# Patient Record
Sex: Female | Born: 1950
Health system: Southern US, Community
[De-identification: ages and names within clinical notes are randomized; demographics above are authoritative.]

## PROBLEM LIST (undated history)

## (undated) DIAGNOSIS — I10 Essential (primary) hypertension: Secondary | ICD-10-CM

## (undated) DIAGNOSIS — N390 Urinary tract infection, site not specified: Secondary | ICD-10-CM

## (undated) DIAGNOSIS — K589 Irritable bowel syndrome without diarrhea: Secondary | ICD-10-CM

## (undated) DIAGNOSIS — R5383 Other fatigue: Secondary | ICD-10-CM

## (undated) DIAGNOSIS — G245 Blepharospasm: Secondary | ICD-10-CM

## (undated) DIAGNOSIS — R0602 Shortness of breath: Secondary | ICD-10-CM

## (undated) DIAGNOSIS — G43909 Migraine, unspecified, not intractable, without status migrainosus: Secondary | ICD-10-CM

## (undated) HISTORY — DX: Shortness of breath: R06.02

## (undated) HISTORY — DX: Irritable bowel syndrome, unspecified: K58.9

## (undated) HISTORY — PX: OTHER SURGICAL HISTORY: SHX169

## (undated) HISTORY — DX: Blepharospasm: G24.5

## (undated) HISTORY — DX: Migraine, unspecified, not intractable, without status migrainosus: G43.909

## (undated) HISTORY — DX: Other fatigue: R53.83

## (undated) HISTORY — PX: BLEPHAROPLASTY: SUR158

## (undated) HISTORY — DX: Essential (primary) hypertension: I10

## (undated) HISTORY — DX: Urinary tract infection, site not specified: N39.0

---

## 2004-04-21 ENCOUNTER — Encounter: Admission: RE | Admit: 2004-04-21 | Discharge: 2004-05-28 | Payer: Self-pay | Admitting: Orthopedic Surgery

## 2004-08-11 ENCOUNTER — Encounter: Admission: RE | Admit: 2004-08-11 | Discharge: 2004-09-16 | Payer: Self-pay | Admitting: Orthopedic Surgery

## 2005-05-10 ENCOUNTER — Other Ambulatory Visit: Admission: RE | Admit: 2005-05-10 | Discharge: 2005-05-10 | Payer: Self-pay | Admitting: Internal Medicine

## 2006-01-25 ENCOUNTER — Encounter: Admission: RE | Admit: 2006-01-25 | Discharge: 2006-01-25 | Payer: Self-pay | Admitting: Orthopedic Surgery

## 2007-08-03 ENCOUNTER — Encounter: Admission: RE | Admit: 2007-08-03 | Discharge: 2007-08-03 | Payer: Self-pay | Admitting: Orthopedic Surgery

## 2011-09-21 ENCOUNTER — Other Ambulatory Visit: Payer: Self-pay | Admitting: Obstetrics and Gynecology

## 2011-09-21 ENCOUNTER — Other Ambulatory Visit (HOSPITAL_COMMUNITY)
Admission: RE | Admit: 2011-09-21 | Discharge: 2011-09-21 | Disposition: A | Discharge status: Home / Self Care | Payer: BC Managed Care – PPO | Source: Ambulatory Visit | Attending: Obstetrics and Gynecology | Admitting: Obstetrics and Gynecology

## 2011-09-21 DIAGNOSIS — Z124 Encounter for screening for malignant neoplasm of cervix: Secondary | ICD-10-CM | POA: Insufficient documentation

## 2012-12-20 HISTORY — PX: EYE SURGERY: SHX253

## 2014-05-27 ENCOUNTER — Ambulatory Visit: Payer: BC Managed Care – PPO | Attending: Orthopedic Surgery | Admitting: Physical Therapy

## 2014-05-27 DIAGNOSIS — M25559 Pain in unspecified hip: Secondary | ICD-10-CM | POA: Insufficient documentation

## 2014-05-27 DIAGNOSIS — IMO0001 Reserved for inherently not codable concepts without codable children: Secondary | ICD-10-CM | POA: Insufficient documentation

## 2014-05-27 DIAGNOSIS — M545 Low back pain, unspecified: Secondary | ICD-10-CM | POA: Insufficient documentation

## 2014-06-05 ENCOUNTER — Ambulatory Visit: Payer: BC Managed Care – PPO | Admitting: Physical Therapy

## 2014-10-23 ENCOUNTER — Ambulatory Visit (INDEPENDENT_AMBULATORY_CARE_PROVIDER_SITE_OTHER): Payer: BC Managed Care – PPO | Admitting: Neurology

## 2014-10-23 ENCOUNTER — Encounter: Payer: Self-pay | Admitting: Neurology

## 2014-10-23 VITALS — BP 117/65 | HR 62 | Temp 97.6°F | Ht 67.0 in | Wt 143.0 lb

## 2014-10-23 DIAGNOSIS — R29898 Other symptoms and signs involving the musculoskeletal system: Secondary | ICD-10-CM

## 2014-10-23 DIAGNOSIS — R202 Paresthesia of skin: Secondary | ICD-10-CM

## 2014-10-23 NOTE — Progress Notes (Signed)
Subjective:    Patient ID: Patricia Henry is a 63 y.o. female.  HPI     Star Age, MD, PhD [facility] [address], [address] [place], [place] [address]  Dear Dr. Lynann Bologna,   I saw your patient, Patricia Henry, upon your kind request in my neurologic clinic today for initial consultation of her leg weakness. The patient is accompanied by her husband today. As you know, Ms. An is a 31 year old right-handed woman with an underlying medical history of scoliosis, spinal stenosis, hip bursitis (s/p injections in August, helped for 1 month), who has had progressive lower extremity weakness. She had an MRI lumbar spine on 05/10/2014 which showed mild bulging annulus and mild bilateral lateral recess encroachment at L2 2-3, unchanged, no spinal or foraminal stenosis. Mild annular bulge and slight flattening of the ventral thecal sac at L4-5 with mild bilateral lateral recess encroachment. This is a slightly progressive finding. Generous spinal canal without significant spinal or foraminal stenosis.  She reports a 6-12 month history of leg and hip weakness. She feels overall weak. She has difficulty with balance and reports difficulty with walking and climbing stairs in particular. She has never had an EMG or nerve conduction study done. She was in PT for about 6 weeks, which she feels did not help. She goes to the gym and does Yoga.  She denies twitching in her muscles, wasting, or permanent numbness, some intermittent tingling in the feet and her fingertips. No FHx of nerve or muscle disease, such at CMT or ALS. She has occasional leg cramping. She has not fallen. She feels her balance is impaired. She has not used a cane or a walker. She was recently started on a blood pressure medication namely lisinopril and hydrochlorothiazide combination by her primary care physician and takes Ambien 10 mg strength quarter to half a pill as needed. Otherwise, she takes a number of  over the counter supplements including turmeric, multivitamin, calcium and vitamin D, probiotic, cranberry and for dry eyes she takes eyedrops.  Her Past Medical History Is Significant For: Past Medical History  Diagnosis Date  . Meibomitis   . Hypertension   . IBS (irritable bowel syndrome)     Her Past Surgical History Is Significant For: History reviewed. No pertinent past surgical history.  Her Family History Is Significant For: Family History  Problem Relation Age of Onset  . Alzheimer's disease Mother   . Heart failure Mother     Her Social History Is Significant For: History   Social History  . Marital Status: Married    Spouse Name: Patricia Henry    Number of Children: 0  . Years of Education: 15   Occupational History  .      [employer]   Social History Main Topics  . Smoking status: Never Smoker   . Smokeless tobacco: Never Used  . Alcohol Use: 0.0 oz/week    0 Not specified per week     Comment: occas. wine  . Drug Use: No  . Sexual Activity: None   Other Topics Concern  . None   Social History Narrative   Patient consumes 2 cups of caffiene ,right handed    Her Allergies Are:  No Known Allergies:   Her Current Medications Are:  Outpatient Encounter Prescriptions as of 10/23/2014  Medication Sig  . ANA-LEX 2-2 % KIT   . Cholecalciferol (VITAMIN D-3 PO) Take 600 mg by mouth daily. Plus calcium, take daily am  .  CRANBERRY PO Take by mouth.  . Flaxseed, Linseed, (FLAX SEEDS PO) Take by mouth daily.  Marland Kitchen lisinopril-hydrochlorothiazide (PRINZIDE,ZESTORETIC) 10-12.5 MG per tablet daily.   . Multiple Vitamin (MULTIVITAMIN) capsule Take 1 capsule by mouth daily.  Marland Kitchen Phenylephrine HCl (AFRIN ALLERGY NA) Place into the nose. Diluted with ocean nasal spray  . Probiotic Product (PROBIOTIC DAILY PO) Take by mouth. Flora IB,digestion  . Tetrahydrozoline HCl (EYE DROPS OP) Apply to eye 4 (four) times daily.  . TURMERIC PO Take 1,000 mg by mouth daily.  Marland Kitchen XIFAXAN 550  MG TABS tablet   . zolpidem (AMBIEN) 10 MG tablet Take 10 mg by mouth at bedtime as needed for sleep. 1/4 -1/2 tablet as needed  :   Review of Systems:  Out of a complete 14 point review of systems, all are reviewed and negative with the exception of these symptoms as listed below:   Review of Systems  Constitutional: Positive for fatigue.  Eyes:       Blurred vision  Gastrointestinal: Positive for diarrhea.  Endocrine: Positive for cold intolerance.  Genitourinary:       Incontinence  Musculoskeletal:       Joint pain , joint swelling  Hematological: Bruises/bleeds easily.    Objective:  Neurologic Exam  Physical Exam Physical Examination:   Filed Vitals:   10/23/14 0935  BP: 117/65  Pulse: 62  Temp: 97.6 F (36.4 C)    General Examination: The patient is a very pleasant 63 y.o. female in no acute distress. She appears well-developed and well-nourished and very well groomed. She appears nervous.  HEENT: Normocephalic, atraumatic, pupils are equal, round and reactive to light and accommodation. Funduscopic exam is normal with sharp disc margins noted. Conjunctiva are mildly irritated appearing. She has dry appearing eyes. She blinks multiple times before I can check her eyes with the light. Extraocular tracking is good without limitation to gaze excursion or nystagmus noted. Normal smooth pursuit is noted. Hearing is grossly intact. Tympanic membranes are clear bilaterally. Face is symmetric with normal facial animation and normal facial sensation. Speech is clear with no dysarthria noted. There is no hypophonia. There is no lip, neck/head, jaw or voice tremor. Neck is supple with full range of passive and active motion. There are no carotid bruits on auscultation. Oropharynx exam reveals: moderate mouth dryness, adequate dental hygiene and no significant airway crowding. Mallampati is class II. Tongue protrudes centrally and palate elevates symmetrically.    Chest: Clear to  auscultation without wheezing, rhonchi or crackles noted.  Heart: S1+S2+0, regular and normal without murmurs, rubs or gallops noted.   Abdomen: Soft, non-tender and non-distended with normal bowel sounds appreciated on auscultation.  Extremities: There is no pitting edema in the distal lower extremities bilaterally. Pedal pulses are intact.  Skin: Warm and dry without trophic changes noted. There are no varicose veins.  Musculoskeletal: exam reveals no obvious joint deformities, tenderness or joint swelling or erythema.   Neurologically:  Mental status: The patient is awake, alert and oriented in all 4 spheres. Her immediate and remote memory, attention, language skills and fund of knowledge are appropriate. There is no evidence of aphasia, agnosia, apraxia or anomia. Speech is clear with normal prosody and enunciation. Thought process is linear. Mood is Fairly normal appearing, but she appears anxious.  Cranial nerves II - XII are as described above under HEENT exam. In addition: shoulder shrug is normal with equal shoulder height noted. Motor exam: Normal bulk, strength and tone is noted. There is  no fasciculation especially not in the distal lower extremities and I do not see any atrophy. She has no myoclonus.There is no drift, tremor or rebound. Romberg is negative, but it does take her a while to get settled for Romberg testing. She has in the beginning a lot of sway and finger movements of her upper body trying to reach out with her eyes closed to her husband and myself as I was standing by. However, after a little while Romberg was completely negative. Reflexes are 2+ in the upper extremities and 1+ in the lower extremities. Babinski: Toes are flexor bilaterally. Fine motor skills and coordination: intact with normal finger taps, normal hand movements, normal rapid alternating patting, normal foot taps and normal foot agility.  Cerebellar testing: No dysmetria or intention tremor on finger to  nose testing. Heel to shin is unremarkable bilaterally. There is no truncal or gait ataxia.  Sensory exam: intact to light touch, pinprick, vibration, temperature sense and proprioception in the upper and lower extremities.  Gait, station and balance: She stands easily. No veering to one side is noted. No leaning to one side is noted. Posture is age-appropriate and stance is narrow based. Gait shows normal stride length and normal pace. No problems turning are noted. She turns en bloc. Tandem walk is slightly difficult for her, but she can do it. Intact toe and heel stance is noted.               Assessment and Plan:   In summary, OVIYA AMMAR is a very pleasant 63 y.o.-year old female with an underlying medical history of scoliosis, spinal stenosis, hip bursitis (s/p injections in August, helped for 1 month), who has had progressive lower extremity weakness for the past 6-12 months. Upon examination she has a normal neurological in general exam and I reassured her and her husband in that regard. I'm not sure how to explain her symptoms. I would like to pursue further testing in the form of blood work and EMG nerve conduction studies to her lower extremities. We will keep her posted over the phone with her test results. I will see her back after these are done. She has already pursued physical therapy and did not find it useful. I suggested she start using a cane for safety. I answered all their questions today and the patient and her husband were in agreement.   Thank you very much for allowing me to participate in the care of this nice patient. If I can be of any further assistance to you please do not hesitate to call me at [phone].  Sincerely,   Star Age, MD, PhD

## 2014-10-23 NOTE — Patient Instructions (Signed)
We will do more testing with blood work and electrical nerve and muscle testing. We will call you with the results.  Try using a cane for balance.

## 2014-10-24 NOTE — Progress Notes (Signed)
Quick Note:  Please call and advise the patient that the recent labs we checked were within normal limits. Please remind patient to keep any upcoming appointments or tests and to call us with any interim questions, concerns, problems or updates. Thanks,  Star Age, MD, PhD    ______

## 2014-10-25 ENCOUNTER — Encounter (INDEPENDENT_AMBULATORY_CARE_PROVIDER_SITE_OTHER): Payer: Self-pay

## 2014-10-25 ENCOUNTER — Ambulatory Visit (INDEPENDENT_AMBULATORY_CARE_PROVIDER_SITE_OTHER): Payer: BC Managed Care – PPO | Admitting: Neurology

## 2014-10-25 DIAGNOSIS — R202 Paresthesia of skin: Secondary | ICD-10-CM

## 2014-10-25 DIAGNOSIS — R29898 Other symptoms and signs involving the musculoskeletal system: Secondary | ICD-10-CM

## 2014-10-25 DIAGNOSIS — Z0289 Encounter for other administrative examinations: Secondary | ICD-10-CM

## 2014-10-25 LAB — IFE AND PE, SERUM
ALPHA2 GLOB SERPL ELPH-MCNC: 0.7 g/dL (ref 0.4–1.2)
Albumin SerPl Elph-Mcnc: 4 g/dL (ref 3.2–5.6)
Albumin/Glob SerPl: 1.5 (ref 0.7–2.0)
Alpha 1: 0.2 g/dL (ref 0.1–0.4)
B-GLOBULIN SERPL ELPH-MCNC: 0.9 g/dL (ref 0.6–1.3)
GAMMA GLOB SERPL ELPH-MCNC: 0.8 g/dL (ref 0.5–1.6)
GLOBULIN, TOTAL: 2.7 g/dL (ref 2.0–4.5)
IGA/IMMUNOGLOBULIN A, SERUM: 192 mg/dL (ref 91–414)
IGG (IMMUNOGLOBIN G), SERUM: 750 mg/dL (ref 700–1600)
IGM (IMMUNOGLOBULIN M), SRM: 99 mg/dL (ref 40–230)
Total Protein: 6.7 g/dL (ref 6.0–8.5)

## 2014-10-25 LAB — HGB A1C W/O EAG: HEMOGLOBIN A1C: 5.3 % (ref 4.8–5.6)

## 2014-10-25 LAB — TSH: TSH: 2.74 u[IU]/mL (ref 0.450–4.500)

## 2014-10-25 LAB — RHEUMATOID FACTOR: Rhuematoid fact SerPl-aCnc: 9.9 IU/mL (ref 0.0–13.9)

## 2014-10-25 LAB — VITAMIN E: VITAMIN E (ALPHA TOCOPHEROL): 18.5 mg/L — AB (ref 4.6–17.8)

## 2014-10-25 LAB — VITAMIN D 25 HYDROXY (VIT D DEFICIENCY, FRACTURES): Vit D, 25-Hydroxy: 39.3 ng/mL (ref 30.0–100.0)

## 2014-10-25 LAB — ANA W/REFLEX: Anti Nuclear Antibody(ANA): NEGATIVE

## 2014-10-25 LAB — SEDIMENTATION RATE: SED RATE: 2 mm/h (ref 0–40)

## 2014-10-25 LAB — RPR: RPR: NONREACTIVE

## 2014-10-25 LAB — B12 AND FOLATE PANEL: Vitamin B-12: 674 pg/mL (ref 211–946)

## 2014-10-25 LAB — CK: CK TOTAL: 107 U/L (ref 24–173)

## 2014-10-25 NOTE — Progress Notes (Signed)
    [facility]    Provider:  Dr Jaynee Eagles Referring Provider: Stephens Shire, MD Primary Care Physician:  Stephens Shire, MD  History:  [name] is a 63 y.o. female here as a referral from Dr. Tollie Pizza for weakness of the bilateral legs. She still goes to the gym but has weakness especially after exertion. She did the recumbent bike for 30 minutes without a problem but then had a difficult time walking. Stairs are becoming difficult. If she puts weight on her legs, she feels weak. She has pain in the hips. She can get out of low seats without problems. She has chronic low back pain. No radicular symptoms but when gets out of bed has numbness and tingling in the legs that resolve with movement. Neither leg is worse but they seem to alternate in severity - currently the right leg is worse. She can't walk more than 1/2 mile without fatigue and then can hardly pick up the legs like she has 10 pounds of sugar strapped on her legs. Symptoms consistently start after a 1/2 mile and resolve with a few minutes of sitting; Sitting helps and then she can get up and walk again.   Summary  Nerve conduction studies were performed on the bilateral lower extremities:  Bilateral Tibial and Peroneal motor nerves showed normal conductions with normal F Wave latencies Bilateral Peroneal sensory nerve conductions were within normal limits Bilateral H Reflexes showed normal latencies  EMG Needle study was performed on selected right lower extremity and right paraspinal muscles:   The Iliopsoas, Adductor Magnus, Vastus Medialis, Anterior Tibialis, Medial Gastrocnemius, Extensor Hallucis Longus, Biceps Femoris (long head), Gluteus Maximus, Gluteus Medius, L3/L4/L5/S1 paraspinal muscles were within normal limits.  Conclusion: This is a normal study. No electrophysiologic evidence for myopathy/myositis, peripheral polyneuropathy or lumbosacral radiculopathy. However patient's symptoms are  suggestive of neurogenic claudication and many patients with lumbar spine stenosis have a normal EMG/NCS evaluation.  Clinical correlation recommended.   Sarina Ill, MD  [facility] [address] [place], [place] [address]  Phone [phone] Fax [phone]

## 2014-10-28 ENCOUNTER — Telehealth: Payer: Self-pay | Admitting: Neurology

## 2014-10-28 NOTE — Telephone Encounter (Signed)
Please call and advise the patient that the recent EMG and nerve conduction velocity test, which is the electrical nerve and muscle test we we performed, was reported as within normal limits. We checked for abnormal electrical discharges in the muscles or nerves and the report suggested normal findings. No further action is required on this test at this time. Please remind patient to keep any upcoming appointments or tests and to call us with any interim questions, concerns, problems or updates. Thanks,  Bernhard Koskinen, MD, PhD 

## 2014-10-29 NOTE — Telephone Encounter (Signed)
Called and left VM message for return call to share Dr Guadelupe Sabin message with patient

## 2014-10-29 NOTE — Telephone Encounter (Signed)
Please advise patient that we can certainly play it by ear. The only lab test that was slightly abnormal was the vitamin E level which was borderline elevated. If she is taking vitamin E supplement she may not need to continue with it.if she feels stable by the time she her two-month follow-up appointment approaches she can decide if she wants to keep it or cancel it. It may be a good idea to try to keep the appointment just so she has it if she needs it. She can always cancel it a day or 2 ahead of time.

## 2014-10-29 NOTE — Telephone Encounter (Signed)
Shared results with patient and she verbalized understanding but wanted to know if she should keep her 2 month f/u appt.since all test are normal?

## 2014-10-30 NOTE — Telephone Encounter (Signed)
Patient returned Patricia Henry's call and I shared Dr. Guadelupe Sabin message with patient.

## 2014-12-23 ENCOUNTER — Ambulatory Visit: Payer: BC Managed Care – PPO | Admitting: Neurology

## 2014-12-26 ENCOUNTER — Ambulatory Visit: Payer: BC Managed Care – PPO | Admitting: Internal Medicine

## 2015-02-18 ENCOUNTER — Ambulatory Visit: Payer: BC Managed Care – PPO | Admitting: Internal Medicine

## 2016-11-03 ENCOUNTER — Encounter: Payer: Self-pay | Admitting: Internal Medicine

## 2016-11-30 ENCOUNTER — Encounter: Payer: Self-pay | Admitting: Physician Assistant

## 2016-11-30 ENCOUNTER — Encounter (INDEPENDENT_AMBULATORY_CARE_PROVIDER_SITE_OTHER): Payer: Self-pay

## 2016-11-30 ENCOUNTER — Ambulatory Visit (INDEPENDENT_AMBULATORY_CARE_PROVIDER_SITE_OTHER): Payer: Medicare Other | Admitting: Physician Assistant

## 2016-11-30 VITALS — BP 130/74 | HR 72 | Ht 67.0 in | Wt 154.6 lb

## 2016-11-30 DIAGNOSIS — K580 Irritable bowel syndrome with diarrhea: Secondary | ICD-10-CM

## 2016-11-30 DIAGNOSIS — K529 Noninfective gastroenteritis and colitis, unspecified: Secondary | ICD-10-CM

## 2016-11-30 MED ORDER — RIFAXIMIN 550 MG PO TABS: 550.0000 mg | ORAL_TABLET | Freq: Three times a day (TID) | ORAL | 0 refills | Status: DC

## 2016-11-30 NOTE — Patient Instructions (Addendum)
We sent a prescription to [facility].  1. Xifaxan 550 mg.  Continue a Probiotic daily.

## 2016-11-30 NOTE — Progress Notes (Addendum)
Subjective:    Patient ID: Patricia Henry, female    DOB: 06-25-1951, 65 y.o.   MRN: [id number]  HPI Patricia Henry is a pleasant 65 year old white female, new to GI today who wishes to establish with Dr. Carlean Purl. She states that she had prior workup with Dr.Mann with colonoscopy 5 or 6 years ago which she believes was negative. He was given a diagnosis of IBS. She states that she has been seeing a naturopath/Jill Meredith Pel and has been on a variety of supplements and probiotics. She did get a course of Xifaxan in July 2017, she does not remember how long she took the medication but states that it did seem to help her symptoms but shortly thereafter she was diagnosed with urinary tract infection and had take a course of Cipro. She says now over the past 6 weeks she has been having more ongoing diarrhea. She says generally she'll have one to 2 loose stools per day that over the past 6 weeks has been having 3-4 loose stools per day. She's not noted any melena or hematochezia. She's not having any significant bloating or distention no nausea or vomiting. She does get some intermittent cramping. She says she would like to be treated for small intestinal bacterial overgrowth with a course of Xifaxan. She is also had food allergy testing through her naturopath and has a variety of foods that she is sensitive to including cows milk, cheese cumin cayenne pepper eggplant. She has been avoiding these foods. She also says that she's been under a lot of stress recently with furniture market and home renovations. Family history is negative for colon cancer and IBD.  Review of Systems Pertinent positive and negative review of systems were noted in the above HPI section.  All other review of systems was otherwise negative.  Outpatient Encounter Prescriptions as of 11/30/2016  Medication Sig  . clonazePAM (KLONOPIN) 1 MG tablet Take 1 tablet by mouth at bedtime.  Marland Kitchen CRANBERRY PO Take by mouth.  . NON FORMULARY   .  Phenylephrine HCl (AFRIN ALLERGY NA) Place into the nose. Diluted with ocean nasal spray  . Probiotic Product (PROBIOTIC DAILY PO) Take by mouth. Flora IB,digestion  . rifaximin (XIFAXAN) 550 MG TABS tablet Take 1 tablet (550 mg total) by mouth 3 (three) times daily.  . [DISCONTINUED] ANA-LEX 2-2 % KIT   . [DISCONTINUED] Cholecalciferol (VITAMIN D-3 PO) Take 600 mg by mouth daily. Plus calcium, take daily am  . [DISCONTINUED] Flaxseed, Linseed, (FLAX SEEDS PO) Take by mouth daily.  . [DISCONTINUED] lisinopril-hydrochlorothiazide (PRINZIDE,ZESTORETIC) 10-12.5 MG per tablet daily.   . [DISCONTINUED] Multiple Vitamin (MULTIVITAMIN) capsule Take 1 capsule by mouth daily.  . [DISCONTINUED] Tetrahydrozoline HCl (EYE DROPS OP) Apply to eye 4 (four) times daily.  . [DISCONTINUED] TURMERIC PO Take 1,000 mg by mouth daily.  . [DISCONTINUED] XIFAXAN 550 MG TABS tablet   . [DISCONTINUED] zolpidem (AMBIEN) 10 MG tablet Take 10 mg by mouth at bedtime as needed for sleep. 1/4 -1/2 tablet as needed   No facility-administered encounter medications on file as of 11/30/2016.    No Known Allergies There are no active problems to display for this patient.  Social History   Social History  . Marital status: Married    Spouse name: Jenny Reichmann  . Number of children: 0  . Years of education: 15   Occupational History  .      [employer]   Social History Main Topics  . Smoking status: Never Smoker  .  Smokeless tobacco: Never Used  . Alcohol use 0.0 oz/week     Comment: occas. wine  . Drug use: No  . Sexual activity: Not on file   Other Topics Concern  . Not on file   Social History Narrative   Patient consumes 2 cups of caffiene ,right handed    Ms. Harrington's family history includes Alzheimer's disease in her mother; Heart failure in her mother.      Objective:    Vitals:   11/30/16 0934  BP: 130/74  Pulse: 72    Physical Exam Well-developed older white female in no acute distress, blood  pressure 130/74 pulse 72 height 5 foot 7 weight 154, BMI 24.2. HEENT; nontraumatic, cephalic EOMI PERRLA sclera anicteric, Cardiovascular; regular rate and rhythm with S1-S2 no murmur or gallop, Pulmonary ;clear bilaterally, Abdomen; soft, nontender nondistended bowel sounds are active no palpable mass or hepatosplenomegaly, Rectal; exam and, Ext;no clubbing cyanosis or edema skin warm and dry, Neuropsych ;mood and affect appropriate       Assessment & Plan:   #28 65 year old white female with probable IBS D, and possible small bowel bacterial overgrowth  Plan; Patient will continue daily probiotic, and avoid trigger foods Will obtain her previous records from Dr. Juanita Craver Start Xifaxan 550 mg by mouth 3 times a day 14 days Plan follow-up office visit with Dr. Carlean Purl in 6 weeks. She is advised to call in the interim for problems.   Addendum; colonoscopy report obtained from Dr. Juanita Craver - Colonoscopy 10/13/2011 with finding of very redundant colon otherwise normal exam to the cecum.  Patricia Heiner Genia Harold PA-C 11/30/2016   Cc: Stephens Shire, MD

## 2016-11-30 NOTE — Progress Notes (Signed)
Agree with Ms. Esterwood's assessment and plan. Denver Harder E. Dhamar Gregory, MD, FACG   

## 2016-12-17 ENCOUNTER — Telehealth: Payer: Self-pay | Admitting: Physician Assistant

## 2016-12-17 NOTE — Telephone Encounter (Signed)
Please advise on her return to care.

## 2016-12-21 NOTE — Telephone Encounter (Signed)
Spoke with pt and she is aware.

## 2016-12-21 NOTE — Telephone Encounter (Signed)
Im glad she is better!..she does not need to make appt with Dr Carlean Purl at this time. I can see her as needed, and can treat again with Xifaxan in several months if needed.

## 2017-01-03 ENCOUNTER — Ambulatory Visit: Payer: Self-pay | Admitting: Internal Medicine

## 2017-01-27 DIAGNOSIS — L814 Other melanin hyperpigmentation: Secondary | ICD-10-CM | POA: Diagnosis not present

## 2017-01-27 DIAGNOSIS — D2362 Other benign neoplasm of skin of left upper limb, including shoulder: Secondary | ICD-10-CM | POA: Diagnosis not present

## 2017-01-27 DIAGNOSIS — D1801 Hemangioma of skin and subcutaneous tissue: Secondary | ICD-10-CM | POA: Diagnosis not present

## 2017-01-27 DIAGNOSIS — D2261 Melanocytic nevi of right upper limb, including shoulder: Secondary | ICD-10-CM | POA: Diagnosis not present

## 2017-01-27 DIAGNOSIS — L570 Actinic keratosis: Secondary | ICD-10-CM | POA: Diagnosis not present

## 2017-01-27 DIAGNOSIS — L821 Other seborrheic keratosis: Secondary | ICD-10-CM | POA: Diagnosis not present

## 2017-01-27 DIAGNOSIS — D225 Melanocytic nevi of trunk: Secondary | ICD-10-CM | POA: Diagnosis not present

## 2017-01-27 DIAGNOSIS — L918 Other hypertrophic disorders of the skin: Secondary | ICD-10-CM | POA: Diagnosis not present

## 2017-01-27 DIAGNOSIS — D2272 Melanocytic nevi of left lower limb, including hip: Secondary | ICD-10-CM | POA: Diagnosis not present

## 2017-02-15 DIAGNOSIS — Z1231 Encounter for screening mammogram for malignant neoplasm of breast: Secondary | ICD-10-CM | POA: Diagnosis not present

## 2017-02-16 DIAGNOSIS — I10 Essential (primary) hypertension: Secondary | ICD-10-CM | POA: Diagnosis not present

## 2017-02-16 DIAGNOSIS — H16292 Other keratoconjunctivitis, left eye: Secondary | ICD-10-CM | POA: Diagnosis not present

## 2017-02-16 DIAGNOSIS — Z87891 Personal history of nicotine dependence: Secondary | ICD-10-CM | POA: Diagnosis not present

## 2017-02-16 DIAGNOSIS — H0289 Other specified disorders of eyelid: Secondary | ICD-10-CM | POA: Diagnosis not present

## 2017-02-16 DIAGNOSIS — G245 Blepharospasm: Secondary | ICD-10-CM | POA: Diagnosis not present

## 2017-02-16 DIAGNOSIS — H0016 Chalazion left eye, unspecified eyelid: Secondary | ICD-10-CM | POA: Diagnosis not present

## 2017-02-16 DIAGNOSIS — Z9889 Other specified postprocedural states: Secondary | ICD-10-CM | POA: Diagnosis not present

## 2017-02-16 DIAGNOSIS — Z79899 Other long term (current) drug therapy: Secondary | ICD-10-CM | POA: Diagnosis not present

## 2017-03-14 DIAGNOSIS — L718 Other rosacea: Secondary | ICD-10-CM | POA: Diagnosis not present

## 2017-03-14 DIAGNOSIS — H5213 Myopia, bilateral: Secondary | ICD-10-CM | POA: Diagnosis not present

## 2017-03-14 DIAGNOSIS — G245 Blepharospasm: Secondary | ICD-10-CM | POA: Diagnosis not present

## 2017-03-14 DIAGNOSIS — H04123 Dry eye syndrome of bilateral lacrimal glands: Secondary | ICD-10-CM | POA: Diagnosis not present

## 2017-03-14 DIAGNOSIS — H524 Presbyopia: Secondary | ICD-10-CM | POA: Diagnosis not present

## 2017-03-14 DIAGNOSIS — H52203 Unspecified astigmatism, bilateral: Secondary | ICD-10-CM | POA: Diagnosis not present

## 2017-03-14 DIAGNOSIS — H0289 Other specified disorders of eyelid: Secondary | ICD-10-CM | POA: Diagnosis not present

## 2017-03-14 DIAGNOSIS — H2513 Age-related nuclear cataract, bilateral: Secondary | ICD-10-CM | POA: Diagnosis not present

## 2017-03-31 DIAGNOSIS — G245 Blepharospasm: Secondary | ICD-10-CM | POA: Diagnosis not present

## 2017-04-04 ENCOUNTER — Encounter: Payer: Self-pay | Admitting: Physician Assistant

## 2017-04-04 ENCOUNTER — Ambulatory Visit (INDEPENDENT_AMBULATORY_CARE_PROVIDER_SITE_OTHER): Payer: Medicare Other | Admitting: Physician Assistant

## 2017-04-04 VITALS — BP 104/68 | HR 61 | Ht 67.0 in | Wt 153.0 lb

## 2017-04-04 DIAGNOSIS — K580 Irritable bowel syndrome with diarrhea: Secondary | ICD-10-CM | POA: Diagnosis not present

## 2017-04-04 DIAGNOSIS — R001 Bradycardia, unspecified: Secondary | ICD-10-CM | POA: Diagnosis not present

## 2017-04-04 DIAGNOSIS — G245 Blepharospasm: Secondary | ICD-10-CM | POA: Diagnosis not present

## 2017-04-04 NOTE — Progress Notes (Addendum)
Subjective:    Patient ID: Patricia Henry, female    DOB: July 18, 1951, 66 y.o.   MRN: [id number]  HPI Patricia Henry is a pleasant 66 year old white female, established with Dr. Carlean Purl. She has history of chronic functional diarrhea/IBS D. She was seen in the office by myself in December 2017 and at that time and been having diarrhea for 6 weeks persistently. She was on a probiotic and had been given a previous course of Xifaxan which she had found beneficial by another physician. 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 Her diarrhea completely resolved and she had no problems for 7 or 8 weeks and then her diarrhea has gradually come back. She has several trigger foods which she tries to avoid, including most dairy, ginger and spices. She doesn't use any artificial sweeteners and is familiar with a fod- map diet. She says she began having 3-5 loose "[name]" bowel movements per day. She generally is not having any abdominal pain but sometimes get some mild lower abdominal discomfort. Her appetite is been fine her weight has been stable. She started using OTC Imodium 1 mg twice daily and says that at this point she is about 90% better. She's having 1-2 formed or semi-formed bowel movements per day. She made the appointment because she 1 and to discuss another course of Xifaxan and also to see if it was safe for her to stay on Imodium long-term. She had colonoscopy in October 2012 with finding of a redundant colon otherwise negative exam. This was done per Dr. Haroldine Laws that time she was not having ongoing diarrhea and did not have random biopsies done.  Review of Systems Pertinent positive and negative review of systems were noted in the above HPI section.  All other review of systems was otherwise negative.  Outpatient Encounter Prescriptions as of 04/04/2017  Medication Sig  . Calcium Carbonate-Vit D-Min (CALCIUM 1200 PO) Take 1 capsule by mouth daily.   . clonazePAM (KLONOPIN) 1 MG tablet Take 1 tablet by mouth at bedtime.  Marland Kitchen CRANBERRY PO Take by mouth.  . NON FORMULARY   . Phenylephrine HCl (AFRIN ALLERGY NA) Place into the nose. Diluted with ocean nasal spray  . [DISCONTINUED] Probiotic Product (PROBIOTIC DAILY PO) Take by mouth. Flora IB,digestion  . [DISCONTINUED] rifaximin (XIFAXAN) 550 MG TABS tablet Take 1 tablet (550 mg total) by mouth 3 (three) times daily.   No facility-administered encounter medications on file as of 04/04/2017.    No Known Allergies There are no active problems to display for this patient.  Social History   Social History  . Marital status: Married    Spouse name: Patricia Henry  . Number of children: 0  . Years of education: 15   Occupational History  .      [employer]   Social History Main Topics  . Smoking status: Never Smoker  . Smokeless tobacco: Never Used  . Alcohol use 0.0 oz/week     Comment: occas. wine  . Drug use: No  . Sexual activity: Not on file   Other Topics Concern  . Not on file   Social History Narrative   Patient consumes 2 cups of caffiene ,right handed    Ms. Antu's family history includes Alzheimer's disease in her mother; Heart failure in her mother.      Objective:    Vitals:   04/04/17 1406  BP: 104/68  Pulse: 61    Physical  Exam well-developed older white female in no acute distress, pleasant blood pressure 104/68 pulse 61 5 foot 7, weight 153, BMI 23.9. HEENT nontraumatic normocephalic EOMI PERRLA sclera anicteric, Cardiovascular regular rate and rhythm with S1-S2 no murmur or gallop, Pulmonary clear bilaterally, Abdomen soft, nontender nondistended bowel sounds are active there is no palpable mass or hepatosplenomegaly, Rectal exam not done, Neuropsych mood and affect appropriate       Assessment & Plan:   #51 66 year old white female with chronic functional diarrhea/IBS D with symptoms over the past 5 years or so. She has had very good response to Xifaxan  in the past diarrhea typically returns. She is currently using Imodium 1 mg twice daily with good success. Only other consideration would be microscopic colitis however doubt she would have had such a good response with Xifaxan. #2 colon cancer screening-last colonoscopy 2012 negative, would need follow-up 2022  Plan; Patient will continue daily probiotic, continue avoidance of dairy and other known trigger foods She was assured that it was fine to continue on over-the-counter Imodium twice daily, and this is acceptable management for functional diarrhea. She will call back if symptoms worsen and she decides she would like to take another course of Xifaxan. At this point plan follow-up colonoscopy 2022/Dr. Carlean Purl.  Amy Genia Harold PA-C 04/04/2017   Cc: Red Christians, MD  Agree with Ms. Genia Harold assessment and plan. Gatha Mayer, MD, [facility]

## 2017-04-04 NOTE — Patient Instructions (Signed)
Continue Imodium 1 mg twice a day.   Continue daily probiotic.

## 2017-05-18 DIAGNOSIS — G245 Blepharospasm: Secondary | ICD-10-CM | POA: Diagnosis not present

## 2017-06-29 DIAGNOSIS — H2513 Age-related nuclear cataract, bilateral: Secondary | ICD-10-CM | POA: Diagnosis not present

## 2017-06-29 DIAGNOSIS — H16293 Other keratoconjunctivitis, bilateral: Secondary | ICD-10-CM | POA: Diagnosis not present

## 2017-06-29 DIAGNOSIS — G245 Blepharospasm: Secondary | ICD-10-CM | POA: Diagnosis not present

## 2017-06-29 DIAGNOSIS — H0289 Other specified disorders of eyelid: Secondary | ICD-10-CM | POA: Diagnosis not present

## 2017-06-29 DIAGNOSIS — L718 Other rosacea: Secondary | ICD-10-CM | POA: Diagnosis not present

## 2017-07-21 DIAGNOSIS — B351 Tinea unguium: Secondary | ICD-10-CM | POA: Diagnosis not present

## 2017-08-07 ENCOUNTER — Encounter: Payer: Self-pay | Admitting: Physician Assistant

## 2017-08-10 ENCOUNTER — Other Ambulatory Visit: Payer: Self-pay

## 2017-08-10 ENCOUNTER — Telehealth: Payer: Self-pay | Admitting: Physician Assistant

## 2017-08-10 ENCOUNTER — Other Ambulatory Visit: Payer: Self-pay | Admitting: *Deleted

## 2017-08-10 ENCOUNTER — Telehealth: Payer: Self-pay | Admitting: *Deleted

## 2017-08-10 MED ORDER — RIFAXIMIN 550 MG PO TABS: 550.0000 mg | ORAL_TABLET | Freq: Three times a day (TID) | ORAL | 0 refills | Status: DC

## 2017-08-10 MED ORDER — RIFAXIMIN 550 MG PO TABS: 550.0000 mg | ORAL_TABLET | Freq: Three times a day (TID) | ORAL | 0 refills | Status: AC

## 2017-08-10 NOTE — Telephone Encounter (Signed)
Faxed to [facility], details on Xifaxan 550 mg, 3 times daily x 14 days, no refills.I sent this electronically to [facility]. I also faxed Office note from [facility], 04-04-2017, and [employer] card. Asking them to do a prior authorization for this.

## 2017-08-10 NOTE — Telephone Encounter (Signed)
I contacted the pharmacy it was sent to originally. The Xifaxan will require a prior authorization. I cancelled the Rx at [facility].

## 2017-08-10 NOTE — Telephone Encounter (Signed)
Advised Beth McKew Nurse,  that I will send prescription for Xifaxan 550 mg, # 42, 3 times daily for 14 days to [facility].

## 2017-08-11 ENCOUNTER — Telehealth: Payer: Self-pay | Admitting: Physician Assistant

## 2017-08-11 NOTE — Telephone Encounter (Signed)
Wynetta Fines states that she is working on prior [employer]

## 2017-08-11 NOTE — Telephone Encounter (Signed)
Received fax from [place].  Notated on the paperwork, we are  Currently working with [facility]. They are working on a prior authorization.

## 2017-08-11 NOTE — Telephone Encounter (Signed)
[facility] is needing a few questions answered to get patient prior auth for medication xifanxan approved. They are faxing over paperwork.

## 2017-08-15 NOTE — Telephone Encounter (Signed)
[facility] states that they received the prior auth for patient and will go ahead and contact her.

## 2017-08-16 ENCOUNTER — Telehealth: Payer: Self-pay | Admitting: Physician Assistant

## 2017-08-16 NOTE — Telephone Encounter (Signed)
Patient's cost for the xifaxan is $500.  Please advise ? I told [facility] that is too much and Patricia Henry knows we are checking to see what else she can do.

## 2017-08-16 NOTE — Telephone Encounter (Signed)
Was [facility] to [facility] not [facility], pt aware.  See phone note dated 08/10/17 for more information.

## 2017-08-17 NOTE — Telephone Encounter (Signed)
Lets see if we can get her samples to cover a 14 day course - I will ask leslie to see if rep can help - let pt know we will call her when we have samples rounded up

## 2017-08-17 NOTE — Telephone Encounter (Signed)
Spoke with [name] and told her we will call her when we get word on the samples.

## 2017-08-18 DIAGNOSIS — G245 Blepharospasm: Secondary | ICD-10-CM | POA: Diagnosis not present

## 2017-08-19 ENCOUNTER — Telehealth: Payer: Self-pay | Admitting: *Deleted

## 2017-08-19 NOTE — Telephone Encounter (Signed)
Advised the patient that we have samples of Xifaxan 550 mg, She is to take 1 tab 3 times daily for 14 days.  The patient will be in this morning to pick up the samples. ( 08-19-2017).

## 2017-08-26 ENCOUNTER — Telehealth: Payer: Self-pay | Admitting: Physician Assistant

## 2017-08-26 NOTE — Telephone Encounter (Signed)
Archived the requests for PA in Cover My Meds.  The patient's insurance approved the medication, but it was still too expensive.

## 2017-09-14 DIAGNOSIS — R3915 Urgency of urination: Secondary | ICD-10-CM | POA: Diagnosis not present

## 2017-09-14 DIAGNOSIS — R300 Dysuria: Secondary | ICD-10-CM | POA: Diagnosis not present

## 2017-11-24 DIAGNOSIS — G245 Blepharospasm: Secondary | ICD-10-CM | POA: Diagnosis not present

## 2018-01-17 DIAGNOSIS — R451 Restlessness and agitation: Secondary | ICD-10-CM | POA: Diagnosis not present

## 2018-01-17 DIAGNOSIS — R200 Anesthesia of skin: Secondary | ICD-10-CM | POA: Diagnosis not present

## 2018-01-17 DIAGNOSIS — R42 Dizziness and giddiness: Secondary | ICD-10-CM | POA: Diagnosis not present

## 2018-01-17 DIAGNOSIS — R202 Paresthesia of skin: Secondary | ICD-10-CM | POA: Diagnosis not present

## 2018-01-18 DIAGNOSIS — R42 Dizziness and giddiness: Secondary | ICD-10-CM | POA: Diagnosis not present

## 2018-01-25 DIAGNOSIS — H2513 Age-related nuclear cataract, bilateral: Secondary | ICD-10-CM | POA: Diagnosis not present

## 2018-01-25 DIAGNOSIS — H0288B Meibomian gland dysfunction left eye, upper and lower eyelids: Secondary | ICD-10-CM | POA: Diagnosis not present

## 2018-01-25 DIAGNOSIS — G245 Blepharospasm: Secondary | ICD-10-CM | POA: Diagnosis not present

## 2018-01-25 DIAGNOSIS — H0288A Meibomian gland dysfunction right eye, upper and lower eyelids: Secondary | ICD-10-CM | POA: Diagnosis not present

## 2018-01-25 DIAGNOSIS — L718 Other rosacea: Secondary | ICD-10-CM | POA: Diagnosis not present

## 2018-02-03 DIAGNOSIS — I10 Essential (primary) hypertension: Secondary | ICD-10-CM | POA: Diagnosis not present

## 2018-02-03 DIAGNOSIS — Z13820 Encounter for screening for osteoporosis: Secondary | ICD-10-CM | POA: Diagnosis not present

## 2018-02-03 DIAGNOSIS — Z23 Encounter for immunization: Secondary | ICD-10-CM | POA: Diagnosis not present

## 2018-02-03 DIAGNOSIS — Z0000 Encounter for general adult medical examination without abnormal findings: Secondary | ICD-10-CM | POA: Diagnosis not present

## 2018-02-03 DIAGNOSIS — Z1231 Encounter for screening mammogram for malignant neoplasm of breast: Secondary | ICD-10-CM | POA: Diagnosis not present

## 2018-02-08 DIAGNOSIS — L570 Actinic keratosis: Secondary | ICD-10-CM | POA: Diagnosis not present

## 2018-02-08 DIAGNOSIS — D2362 Other benign neoplasm of skin of left upper limb, including shoulder: Secondary | ICD-10-CM | POA: Diagnosis not present

## 2018-02-08 DIAGNOSIS — L565 Disseminated superficial actinic porokeratosis (DSAP): Secondary | ICD-10-CM | POA: Diagnosis not present

## 2018-02-20 ENCOUNTER — Encounter: Payer: Self-pay | Admitting: Physician Assistant

## 2018-02-21 DIAGNOSIS — Z1231 Encounter for screening mammogram for malignant neoplasm of breast: Secondary | ICD-10-CM | POA: Diagnosis not present

## 2018-03-02 DIAGNOSIS — G245 Blepharospasm: Secondary | ICD-10-CM | POA: Diagnosis not present

## 2018-03-10 DIAGNOSIS — M85852 Other specified disorders of bone density and structure, left thigh: Secondary | ICD-10-CM | POA: Diagnosis not present

## 2018-03-10 DIAGNOSIS — Z780 Asymptomatic menopausal state: Secondary | ICD-10-CM | POA: Diagnosis not present

## 2018-03-10 DIAGNOSIS — Z13820 Encounter for screening for osteoporosis: Secondary | ICD-10-CM | POA: Diagnosis not present

## 2018-03-10 DIAGNOSIS — M8580 Other specified disorders of bone density and structure, unspecified site: Secondary | ICD-10-CM | POA: Diagnosis not present

## 2018-03-13 ENCOUNTER — Ambulatory Visit (INDEPENDENT_AMBULATORY_CARE_PROVIDER_SITE_OTHER): Payer: Medicare Other | Admitting: Physician Assistant

## 2018-03-13 ENCOUNTER — Encounter: Payer: Self-pay | Admitting: Physician Assistant

## 2018-03-13 VITALS — BP 120/76 | HR 80 | Ht 67.0 in | Wt 154.2 lb

## 2018-03-13 DIAGNOSIS — K921 Melena: Secondary | ICD-10-CM

## 2018-03-13 DIAGNOSIS — K580 Irritable bowel syndrome with diarrhea: Secondary | ICD-10-CM

## 2018-03-13 NOTE — Patient Instructions (Addendum)
If you are age 68 or older, your body mass index should be between 23-30. Your Body mass index is 24.16 kg/m. If this is out of the aforementioned range listed, please consider follow up with your Primary Care Provider.  If you are age 54 or younger, your body mass index should be between 19-25. Your Body mass index is 24.16 kg/m. If this is out of the aformentioned range listed, please consider follow up with your Primary Care Provider.   Follow the instructions on the Hemoccult cards and mail them back to [place] when you are finished or you may take them directly to the lab in the basement of the [place] building. We will call you with the results.   Follow-Up with Nicoletta Ba or Dr. Carlean Purl.

## 2018-03-13 NOTE — Progress Notes (Addendum)
Subjective:    Patient ID: Patricia Henry, female    DOB: [birth year], 67 y.o.   MRN: [id number]  HPI Patricia Henry is a pleasant 67 year old white female, known to Dr. Carlean Purl and myself.  She has history of IBS D.  She has required antidiarrheals for some time, currently taking Imodium twice daily. She was last seen in the office in April 2018.  She has been treated with Xifaxan in the past successfully. Last colonoscopy 2012 with redundant colon otherwise negative exam per Dr. Collene Mares. She came in today because she had an episode of passing some pinkish mucus on a couple of occasions about a month ago.  She has not seen any blood or mucus since.  Fortunately she is not been having any recent problems with diarrhea other than if she eats out occasionally symptoms will flare.  She denies any abdominal pain or ongoing cramping but says she did have some bad abdominal pain day after she passed the pinkish mucus. She has been taking CBD oil over the past 7 weeks which she says is helping remarkably with her anxiety and she thinks it may be helping her IBS as well. Family history is negative for colon cancer and polyps.  Review of Systems Pertinent positive and negative review of systems were noted in the above HPI section.  All other review of systems was otherwise negative.  Outpatient Encounter Medications as of 03/13/2018  Medication Sig  . Cannabidiol 100 MG/ML SOLN Take by mouth.  . ciclopirox (PENLAC) 8 % solution APPLY TO AFFECTED AREA AT BEDTIME DAILY OVER PREVIOS COAT FOR 7 DAYS THEN REPEAT  . loperamide (IMODIUM) 2 MG capsule Take 1 mg by mouth 2 (two) times daily.  . methylphenidate (RITALIN) 10 MG tablet Take 10 mg by mouth as directed. Once weekly  . Phenylephrine HCl (AFRIN ALLERGY NA) Place into the nose. Diluted with ocean nasal spray  . [DISCONTINUED] Calcium Carbonate-Vit D-Min (CALCIUM 1200 PO) Take 1 capsule by mouth daily.  . [DISCONTINUED] clonazePAM (KLONOPIN) 1 MG tablet Take 1 tablet  by mouth at bedtime.  . [DISCONTINUED] CRANBERRY PO Take by mouth.  . [DISCONTINUED] NON FORMULARY    No facility-administered encounter medications on file as of 03/13/2018.    No Known Allergies There are no active problems to display for this patient.  Social History   Socioeconomic History  . Marital status: Married    Spouse name: Jenny Reichmann  . Number of children: 0  . Years of education: 70  . Highest education level: Not on file  Occupational History  . Occupation: semi retired    Comment: [place]  . Financial resource strain: Not on file  . Food insecurity:    Worry: Not on file    Inability: Not on file  . Transportation needs:    Medical: Not on file    Non-medical: Not on file  Tobacco Use  . Smoking status: Former Research scientist (life sciences)  . Smokeless tobacco: Never Used  Substance and Sexual Activity  . Alcohol use: Yes    Alcohol/week: 0.0 oz    Comment: occas. wine  . Drug use: No  . Sexual activity: Not on file  Lifestyle  . Physical activity:    Days per week: Not on file    Minutes per session: Not on file  . Stress: Not on file  Relationships  . Social connections:    Talks on phone: Not on file    Gets together: Not on file  Attends religious service: Not on file    Active member of club or organization: Not on file    Attends meetings of clubs or organizations: Not on file    Relationship status: Not on file  . Intimate partner violence:    Fear of current or ex partner: Not on file    Emotionally abused: Not on file    Physically abused: Not on file    Forced sexual activity: Not on file  Other Topics Concern  . Not on file  Social History Narrative   Patient consumes 2 cups of caffiene ,right handed    Ms. Forstner's family history includes Alzheimer's disease in her mother; Heart failure in her mother.      Objective:    Vitals:   03/13/18 1107  BP: 120/76  Pulse: 80    Physical Exam; well-developed older white female in no  acute distress, pleasant blood pressure 120/76 pulse 80, height 5 foot 7, weight 154, BMI 24.1 and HEENT; nontraumatic normocephalic EOMI PERRLA sclera anicteric, Cardiovascular; regular rate and rhythm with S1-S2 no murmur rub or gallop, Pulmonary ;clear bilaterally, Abdomen; soft, nondistended nontender bowel sounds are active no palpable mass or hepatosplenomegaly, Rectal; exam not done, Neuro psych; mood and affect appropriate       Assessment & Plan:   #90 67 year old white female with history of IBS D with episode of passage of pinkish mucus on 2 occasions about a month ago, has not recurred since.  Diarrhea has been under fairly good control with use of Imodium twice daily. Previous treatment with Xifaxan was quite beneficial during previous exacerbation. Etiology of scant hematochezia not clear, rule out internal hemorrhoid, occult lesion.  #2 chronic anxiety  Plan; patient will continue Imodium 1 twice daily She is currently using CBD oil for anxiety and also feels this is helping her IBS We will have her complete Hemoccults, if positive proceed with colonoscopy with Dr. Carlean Purl.  If negative she would be due for follow-up colonoscopy in 2022.  Consider Xifaxan 550 mg p.o. 3 times daily times 14 days if she has future significant exacerbation of diarrhea.    Cherita Hebel S Kailan Laws PA-C 03/13/2018   Cc: Red Christians, MD  Agree with Ms. Genia Harold assessment and plan. Gatha Mayer, MD, [facility]

## 2018-04-17 ENCOUNTER — Other Ambulatory Visit (INDEPENDENT_AMBULATORY_CARE_PROVIDER_SITE_OTHER): Payer: Medicare Other

## 2018-04-17 DIAGNOSIS — K580 Irritable bowel syndrome with diarrhea: Secondary | ICD-10-CM

## 2018-04-17 DIAGNOSIS — K921 Melena: Secondary | ICD-10-CM

## 2018-04-17 LAB — HEMOCCULT SLIDES (X 3 CARDS)
Fecal Occult Blood: NEGATIVE
OCCULT 1: POSITIVE — AB
OCCULT 2: POSITIVE — AB
OCCULT 3: NEGATIVE
OCCULT 4: NEGATIVE
OCCULT 5: NEGATIVE

## 2018-05-01 ENCOUNTER — Encounter: Payer: Self-pay | Admitting: Physician Assistant

## 2018-05-16 ENCOUNTER — Telehealth: Payer: Self-pay | Admitting: Physician Assistant

## 2018-05-16 NOTE — Telephone Encounter (Signed)
Patient says she sees spots of blood more frequently with her bowel movement than she was previously. She feels "very very tired." No recent diet changes, medication changes or constipation. Patient of Dr Carlean Purl who is scheduled for a colonoscopy in July. She had 2 positive hemoccult cards in April. Please advise.

## 2018-05-23 ENCOUNTER — Ambulatory Visit (AMBULATORY_SURGERY_CENTER): Payer: Self-pay

## 2018-05-23 VITALS — Ht 67.0 in | Wt 150.4 lb

## 2018-05-23 DIAGNOSIS — K921 Melena: Secondary | ICD-10-CM

## 2018-05-23 NOTE — Progress Notes (Signed)
Per pt, no allergies to soy or egg products.Pt not taking any weight loss meds or using  O2 at home.  Pt refused emmi video. 

## 2018-06-08 DIAGNOSIS — R51 Headache: Secondary | ICD-10-CM | POA: Diagnosis not present

## 2018-06-08 DIAGNOSIS — G245 Blepharospasm: Secondary | ICD-10-CM | POA: Diagnosis not present

## 2018-06-08 DIAGNOSIS — G589 Mononeuropathy, unspecified: Secondary | ICD-10-CM | POA: Diagnosis not present

## 2018-07-06 ENCOUNTER — Other Ambulatory Visit: Payer: Self-pay

## 2018-07-06 ENCOUNTER — Ambulatory Visit (AMBULATORY_SURGERY_CENTER): Payer: Medicare Other | Admitting: Internal Medicine

## 2018-07-06 ENCOUNTER — Encounter: Payer: Self-pay | Admitting: Internal Medicine

## 2018-07-06 ENCOUNTER — Telehealth: Payer: Self-pay | Admitting: Internal Medicine

## 2018-07-06 VITALS — BP 157/56 | HR 67 | Temp 98.2°F | Resp 30 | Ht 67.0 in | Wt 150.0 lb

## 2018-07-06 DIAGNOSIS — K921 Melena: Secondary | ICD-10-CM | POA: Diagnosis not present

## 2018-07-06 DIAGNOSIS — R195 Other fecal abnormalities: Secondary | ICD-10-CM | POA: Diagnosis not present

## 2018-07-06 MED ORDER — SODIUM CHLORIDE 0.9 % IV SOLN: 500.0000 mL | Freq: Once | INTRAVENOUS | Status: DC

## 2018-07-06 NOTE — Progress Notes (Signed)
To PACU, VSS. Report to RN.tb 

## 2018-07-06 NOTE — Progress Notes (Signed)
Patient denies having any polyps.

## 2018-07-06 NOTE — Telephone Encounter (Signed)
Returned phone call to patient. Questioning if she could start prep at 09-1029 am instead of 11 am. Advised she could start earlier and to finish drinking by 1 pm

## 2018-07-06 NOTE — Patient Instructions (Addendum)
   No polyps or cancer or other problems.  Think hemorrhoids likely causing the bleeding.   No need for routine colonoscopy going forward.  I appreciate the opportunity to care for you. Gatha Mayer, MD, FACG   YOU HAD AN ENDOSCOPIC PROCEDURE TODAY AT [facility]:   Refer to the procedure report that was given to you for any specific questions about what was found during the examination.  If the procedure report does not answer your questions, please call your gastroenterologist to clarify.  If you requested that your care partner not be given the details of your procedure findings, then the procedure report has been included in a sealed envelope for you to review at your convenience later.  YOU SHOULD EXPECT: Some feelings of bloating in the abdomen. Passage of more gas than usual.  Walking can help get rid of the air that was put into your GI tract during the procedure and reduce the bloating. If you had a lower endoscopy (such as a colonoscopy or flexible sigmoidoscopy) you may notice spotting of blood in your stool or on the toilet paper. If you underwent a bowel prep for your procedure, you may not have a normal bowel movement for a few days.  Please Note:  You might notice some irritation and congestion in your nose or some drainage.  This is from the oxygen used during your procedure.  There is no need for concern and it should clear up in a day or so.  SYMPTOMS TO REPORT IMMEDIATELY:   Following lower endoscopy (colonoscopy or flexible sigmoidoscopy):  Excessive amounts of blood in the stool  Significant tenderness or worsening of abdominal pains  Swelling of the abdomen that is new, acute  Fever of 100F or higher  For urgent or emergent issues, a gastroenterologist can be reached at any hour by calling [phone].   DIET:  We do recommend a small meal at first, but then you may proceed to your regular diet.  Drink plenty of fluids but you should  avoid alcoholic beverages for 24 hours.  ACTIVITY:  You should plan to take it easy for the rest of today and you should NOT DRIVE or use heavy machinery until tomorrow (because of the sedation medicines used during the test).    FOLLOW UP: Our staff will call the number listed on your records the next business day following your procedure to check on you and address any questions or concerns that you may have regarding the information given to you following your procedure. If we do not reach you, we will leave a message.  However, if you are feeling well and you are not experiencing any problems, there is no need to return our call.  We will assume that you have returned to your regular daily activities without incident.  If any biopsies were taken you will be contacted by phone or by letter within the next 1-3 weeks.  Please call us at [phone] if you have not heard about the biopsies in 3 weeks.    SIGNATURES/CONFIDENTIALITY: You and/or your care partner have signed paperwork which will be entered into your electronic medical record.  These signatures attest to the fact that that the information above on your After Visit Summary has been reviewed and is understood.  Full responsibility of the confidentiality of this discharge information lies with you and/or your care-partner.

## 2018-07-07 ENCOUNTER — Encounter: Payer: Self-pay | Admitting: Internal Medicine

## 2018-07-07 NOTE — Op Note (Signed)
English Patient Name: Patricia Henry Procedure Date: 07/06/2018 3:00 PM MRN: [id number] Endoscopist: Gatha Mayer , MD Age: 67 Referring MD:  Date of Birth: 1951-08-29 Gender: Female Account #: [id number] Procedure:                Colonoscopy Indications:              Heme positive stool Medicines:                Propofol per Anesthesia, Monitored Anesthesia Care Procedure:                Pre-Anesthesia Assessment:                           - Prior to the procedure, a History and Physical                            was performed, and patient medications and                            allergies were reviewed. The patient's tolerance of                            previous anesthesia was also reviewed. The risks                            and benefits of the procedure and the sedation                            options and risks were discussed with the patient.                            All questions were answered, and informed consent                            was obtained. Prior Anticoagulants: The patient has                            taken no previous anticoagulant or antiplatelet                            agents. ASA Grade Assessment: II - A patient with                            mild systemic disease. After reviewing the risks                            and benefits, the patient was deemed in                            satisfactory condition to undergo the procedure.                           After obtaining informed consent, the colonoscope  was passed under direct vision. Throughout the                            procedure, the patient's blood pressure, pulse, and                            oxygen saturations were monitored continuously. The                            Colonoscope was introduced through the anus and                            advanced to the the cecum, identified by                            appendiceal orifice and  ileocecal valve. The                            colonoscopy was performed without difficulty. The                            patient tolerated the procedure well. The quality                            of the bowel preparation was excellent. The                            ileocecal valve, appendiceal orifice, and rectum                            were photographed. The bowel preparation used was                            Miralax. Scope In: 3:09:26 PM Scope Out: 3:24:36 PM Scope Withdrawal Time: 0 hours 8 minutes 1 second  Total Procedure Duration: 0 hours 15 minutes 10 seconds  Findings:                 The perianal and digital rectal examinations were                            normal.                           The colon (entire examined portion) appeared normal.                           No additional abnormalities were found on                            retroflexion. Complications:            No immediate complications. Estimated Blood Loss:     Estimated blood loss: none. Impression:               - The entire examined colon is normal.                           -  No specimens collected. Recommendation:           - Patient has a contact number available for                            emergencies. The signs and symptoms of potential                            delayed complications were discussed with the                            patient. Return to normal activities tomorrow.                            Written discharge instructions were provided to the                            patient.                           - Resume previous diet.                           - Continue present medications.                           - No repeat colonoscopy due to current age (81                            years or older) and the absence of colonic polyps. Gatha Mayer, MD 07/06/2018 3:34:49 PM This report has been signed electronically.

## 2018-07-10 ENCOUNTER — Telehealth: Payer: Self-pay | Admitting: *Deleted

## 2018-07-10 NOTE — Telephone Encounter (Signed)
  Follow up Call-  Call back number 07/06/2018  Post procedure Call Back phone  # [phone]  Permission to leave phone message Yes  Some recent data might be hidden     Patient questions:  Do you have a fever, pain , or abdominal swelling? No. Pain Score  0 *  Have you tolerated food without any problems? Yes.    Have you been able to return to your normal activities? Yes.    Do you have any questions about your discharge instructions: Diet   No. Medications  No. Follow up visit  No.  Do you have questions or concerns about your Care? No.  Actions: * If pain score is 4 or above: No action needed, pain <4.

## 2018-09-25 DIAGNOSIS — G245 Blepharospasm: Secondary | ICD-10-CM | POA: Diagnosis not present

## 2018-10-04 DIAGNOSIS — M25571 Pain in right ankle and joints of right foot: Secondary | ICD-10-CM | POA: Diagnosis not present

## 2018-10-12 ENCOUNTER — Ambulatory Visit (INDEPENDENT_AMBULATORY_CARE_PROVIDER_SITE_OTHER): Payer: Medicare Other | Admitting: Family Medicine

## 2018-10-12 ENCOUNTER — Encounter: Payer: Self-pay | Admitting: Family Medicine

## 2018-10-12 VITALS — Temp 97.5°F | Ht 67.0 in | Wt 151.0 lb

## 2018-10-12 DIAGNOSIS — Z23 Encounter for immunization: Secondary | ICD-10-CM

## 2018-10-12 DIAGNOSIS — Z7689 Persons encountering health services in other specified circumstances: Secondary | ICD-10-CM | POA: Diagnosis not present

## 2018-10-12 DIAGNOSIS — R42 Dizziness and giddiness: Secondary | ICD-10-CM | POA: Diagnosis not present

## 2018-10-12 DIAGNOSIS — G245 Blepharospasm: Secondary | ICD-10-CM | POA: Diagnosis not present

## 2018-10-12 DIAGNOSIS — Z8669 Personal history of other diseases of the nervous system and sense organs: Secondary | ICD-10-CM

## 2018-10-12 NOTE — Patient Instructions (Signed)
Benign Essential Blepharospasm, Adult Benign essential blepharospasm (BEB) is a nervous system condition that makes a person close his or her eyes without meaning to. Over time, episodes of this condition may gradually become more frequent and forceful, and eventually involve both eyes. This can make it hard for you to keep your eyes open and do activities such as watching television, driving, or reading. If this condition is not treated, the eyes may close forcefully for long periods of time. What are the causes? The exact cause of this condition is not known. The condition may be passed down through families through an abnormal gene. Symptoms of the condition may be triggered by:  The wind.  Sunlight.  Noise.  Stress.  Fatigue.  Bright light.  Reading.  Driving.  Walking outside.  Air pollution.  Eye strain.  What increases the risk? You are more likely to develop this condition if:  You are age 68 or older.  You have a family history of the condition.  You are female.  There are problems with the part of your brain that controls movements (basal ganglia).  You have a movement disorder called dystonia.  You have a history of eye diseases or trauma to the eye(s).  You take certain medicines, such as medicines that treat Parkinson disease.  What are the signs or symptoms? The first symptom of this condition is frequent eye blinking or twitching that you cannot control. It may happen during the day and disappear at night. Other early symptoms include:  Eye dryness and irritation.  Eye irritation or pain from bright lights (photophobia).  You may get temporary relief from your symptoms when you sing, yawn, chew, or laugh. Later symptoms of this condition include:  Winking and squinting for longer than usual.  Muscle spasms in your tongue and jaw (Meigesyndrome).  Inability to keep your eyes open for long periods of time.  Over time, symptoms may get stronger  and last longer. How is this diagnosed? This condition is diagnosed based on your symptoms, your medical history, and a physical exam. How is this treated? There is no cure for this condition, but treatment can help with symptoms. Treatment options include:  Applying moisturizing eye drops (artificial tears) to the eye. These drops help to relieve eye irritation and dryness.  Getting an injection of botulinum toxin into the muscles that control eyelid movement. This treatment may need to be repeated every few months.  Taking medicines such as muscle relaxants and anti-anxiety medicines.  Having surgery to remove part of the eyelid muscles (myectomy). This may be done if injections of botulinum toxin do not work or they stop working.  Follow these instructions at home: Lifestyle  Wear tinted sunglasses that block UV (ultraviolet) light.  Wear eye protection outdoors on windy days.  Get enough sleep.  Try to manage or avoid stressful situations.  Do not watch TV or have screen time for long periods of time.  Avoid things that trigger your condition. General instructions  Learn as much as you can about your condition.  Work closely with your team of health care providers.  Use over-the-counter and prescription medicines, including eye drops, only as told by your health care provider.  Keep all follow-up visits as told by your health care provider. This is important.  Do not drive if you are having an episode that affects how well you can see.  Keep your eyelids clean. Wash them daily with mild soap and water. This will help to  prevent irritation and infection. Contact a health care provider if:  Your symptoms are not controlled with treatment.  Your eyes are red, teary, or dry.  Your eyes droop.  You feel anxious or depressed. Get help right away if:  You cannot open your eyes. Summary  Benign essential blepharospasm (BEB) is a nervous system condition that makes a  person close his or her eyes without meaning to.  The exact cause of this condition is not known. The condition may be passed down through families through an abnormal gene.  There is no cure for this condition, but treatment can help with symptoms. This information is not intended to replace advice given to you by your health care provider. Make sure you discuss any questions you have with your health care provider. Document Released: 11/26/2002 Document Revised: 02/08/2017 Document Reviewed: 02/08/2017 Elsevier Interactive Patient Education  2018 [employer]. Dizziness Dizziness is a common problem. It is a feeling of unsteadiness or light-headedness. You may feel like you are about to faint. Dizziness can lead to injury if you stumble or fall. Anyone can become dizzy, but dizziness is more common in older adults. This condition can be caused by a number of things, including medicines, dehydration, or illness. Follow these instructions at home: Eating and drinking  Drink enough fluid to keep your urine clear or pale yellow. This helps to keep you from becoming dehydrated. Try to drink more clear fluids, such as water.  Do not drink alcohol.  Limit your caffeine intake if told to do so by your health care provider. Check ingredients and nutrition facts to see if a food or beverage contains caffeine.  Limit your salt (sodium) intake if told to do so by your health care provider. Check ingredients and nutrition facts to see if a food or beverage contains sodium. Activity  Avoid making quick movements. ? Rise slowly from chairs and steady yourself until you feel okay. ? In the morning, first sit up on the side of the bed. When you feel okay, stand slowly while you hold onto something until you know that your balance is fine.  If you need to stand in one place for a long time, move your legs often. Tighten and relax the muscles in your legs while you are standing.  Do not drive or use  heavy machinery if you feel dizzy.  Avoid bending down if you feel dizzy. Place items in your home so that they are easy for you to reach without leaning over. Lifestyle  Do not use any products that contain nicotine or tobacco, such as cigarettes and e-cigarettes. If you need help quitting, ask your health care provider.  Try to reduce your stress level by using methods such as yoga or meditation. Talk with your health care provider if you need help to manage your stress. General instructions  Watch your dizziness for any changes.  Take over-the-counter and prescription medicines only as told by your health care provider. Talk with your health care provider if you think that your dizziness is caused by a medicine that you are taking.  Tell a friend or a family member that you are feeling dizzy. If he or she notices any changes in your behavior, have this person call your health care provider.  Keep all follow-up visits as told by your health care provider. This is important. Contact a health care provider if:  Your dizziness does not go away.  Your dizziness or light-headedness gets worse.  You feel  nauseous.  You have reduced hearing.  You have new symptoms.  You are unsteady on your feet or you feel like the room is spinning. Get help right away if:  You vomit or have diarrhea and are unable to eat or drink anything.  You have problems talking, walking, swallowing, or using your arms, hands, or legs.  You feel generally weak.  You are not thinking clearly or you have trouble forming sentences. It may take a friend or family member to notice this.  You have chest pain, abdominal pain, shortness of breath, or sweating.  Your vision changes.  You have any bleeding.  You have a severe headache.  You have neck pain or a stiff neck.  You have a fever. These symptoms may represent a serious problem that is an emergency. Do not wait to see if the symptoms will go away.  Get medical help right away. Call your local emergency services (911 in the [place] Do not drive yourself to the hospital. Summary  Dizziness is a feeling of unsteadiness or light-headedness. This condition can be caused by a number of things, including medicines, dehydration, or illness.  Anyone can become dizzy, but dizziness is more common in older adults.  Drink enough fluid to keep your urine clear or pale yellow. Do not drink alcohol.  Avoid making quick movements if you feel dizzy. Monitor your dizziness for any changes. This information is not intended to replace advice given to you by your health care provider. Make sure you discuss any questions you have with your health care provider. Document Released: 06/01/2001 Document Revised: 01/08/2017 Document Reviewed: 01/08/2017 Elsevier Interactive Patient Education  [employer].

## 2018-10-12 NOTE — Progress Notes (Signed)
Patient presents to clinic today to establish care.  SUBJECTIVE: PMH: Pt is a 67 yo female with pmh sig for h/o Migraines, bursitis, and benign essential blepherospasm.  Pt was previously seen at [facility].  Dizziness: -Pt endorses 2 episodes of dizziness/feeling "off" -Patient notes recent episode of SOB and feeling funny after exercising-EMS was called. -Pt notes after work out has to catch her breath/gather herself prior to being able to drive. -pt states her pulse was normal then increased to 102 the went back down per her smart watch. -the other episode was at dinner after having a glass of red wine.  -pt drinking plenty of water daily  H/o Migraines: -occurred in pt's 20s -have since resolved, states she out grew them -not on meds  Benign essential Blepharospasm: -followed by [facility], Dr. Ellin Mayhew -s/p blephroplasty -taking clonazepam 0.5 mg qhs prn for sleep and ritalin prn -reducing stress helps reduce blepharospasms.  Allergies: NKDA  Social hx: Pt is married.  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  Pt endorses social EtOH use, may have one drink 5x/wk.  Pt is a former smoker, smoked 1ppd x 20 yrs.  Pt denies drug use.  Health Maintenance: Dental --Dr. Rolla Etienne Vision --Dr. Renard Hamper and Dr. Johnathan Hausen Gastroenterology--Dr. Carlean Purl Neurologist--Dr. Star Age Also followed by Dr. Algis Greenhouse and Dr. Lavona Mound Immunizations -- shingles vaccine 2016, pneumonia vaccine 2018, influenza vaccine 2018 Colonoscopy --2019 Mammogram -- 2018 PAP -- 2017  Past Medical History:  Diagnosis Date  . Benign essential blepharospasm   . Hypertension    no meds  . IBS (irritable bowel syndrome)   . Migraines   . UTI (urinary tract infection)     Past Surgical History:  Procedure Laterality Date  . BLEPHAROPLASTY    . EYE SURGERY  2014   SLK/ eyelid surgery/ Bil eyes  . ovary removed     right side/ over 20 years    Current  Outpatient Medications on File Prior to Visit  Medication Sig Dispense Refill  . Calcium Carb-Cholecalciferol (CALCIUM 600 + D PO) Take by mouth 2 (two) times daily.    . Cannabidiol 100 MG/ML SOLN Take by mouth. Take 6 drops tid    . clonazePAM (KLONOPIN) 0.5 MG tablet Take 0.5 mg by mouth as needed for anxiety.    Marland Kitchen loperamide (IMODIUM) 2 MG capsule Take 1 mg by mouth. Take half tablet daily    . methylphenidate (RITALIN) 10 MG tablet Take 10 mg by mouth as needed.     . OnabotulinumtoxinA (BOTOX IJ) Inject as directed. Inject into eyelids every 90 days    . Phenylephrine HCl (AFRIN ALLERGY NA) Place into the nose as needed. Diluted with ocean nasal spray      No current facility-administered medications on file prior to visit.     No Known Allergies  Family History  Problem Relation Age of Onset  . Alzheimer's disease Mother   . Heart failure Mother     Social History   Socioeconomic History  . Marital status: Married    Spouse name: Jenny Reichmann  . Number of children: 0  . Years of education: 62  . Highest education level: Not on file  Occupational History  . Occupation: semi retired    Comment: [employer]  . Financial resource strain: Not on file  . Food insecurity:    Worry: Not on file    Inability: Not on file  . Transportation needs:  Medical: Not on file    Non-medical: Not on file  Tobacco Use  . Smoking status: Former Smoker    Last attempt to quit: 05/23/2013    Years since quitting: 5.3  . Smokeless tobacco: Never Used  Substance and Sexual Activity  . Alcohol use: Yes    Alcohol/week: 7.0 standard drinks    Types: 7 Glasses of wine per week  . Drug use: No  . Sexual activity: Not on file  Lifestyle  . Physical activity:    Days per week: Not on file    Minutes per session: Not on file  . Stress: Not on file  Relationships  . Social connections:    Talks on phone: Not on file    Gets together: Not on file    Attends religious service:  Not on file    Active member of club or organization: Not on file    Attends meetings of clubs or organizations: Not on file    Relationship status: Not on file  . Intimate partner violence:    Fear of current or ex partner: Not on file    Emotionally abused: Not on file    Physically abused: Not on file    Forced sexual activity: Not on file  Other Topics Concern  . Not on file  Social History Narrative   Patient consumes 2 cups of caffiene ,right handed    ROS General: Denies fever, chills, night sweats, changes in weight, changes in appetite  +dizziness HEENT: Denies headaches, ear pain, changes in vision, rhinorrhea, sore throat  +blepharospasm, h/o migraines CV: Denies CP, palpitations, SOB, orthopnea Pulm: Denies SOB, cough, wheezing GI: Denies abdominal pain, nausea, vomiting, diarrhea, constipation GU: Denies dysuria, hematuria, frequency, vaginal discharge Msk: Denies muscle cramps, joint pains Neuro: Denies weakness, numbness, tingling Skin: Denies rashes, bruising Psych: Denies depression, anxiety, hallucinations  BP 106/64 (BP Location: Left Arm, Patient Position: Sitting, Cuff Size: Normal)   Pulse 70   Temp (!) 97.5 F (36.4 C) (Oral)   Ht 5\' 7"  (1.702 m)   Wt 151 lb (68.5 kg)   SpO2 98%   BMI 23.65 kg/m  No data found. Orthostatic bps normal 120s/80s  Physical Exam Gen. Pleasant, well developed, well-nourished, in NAD HEENT - [place]/AT, PERRL, no scleral icterus, no nasal drainage Lungs: no use of accessory muscles, CTAB, no wheezes, rales or rhonchi Cardiovascular: RRR, No r/g/m, no peripheral edema Neuro:  A&Ox3, CN II-XII intact, normal gait Skin:  Warm, dry, intact, no lesions  No results found for this or any previous visit (from the past 2160 hour(s)).  Assessment/Plan: Dizziness -discussed staying hydrated, limiting caffeine intake, and getting plenty of rest -will obtain recent labs from pt's previous provider(s) -discussed reducing intensity of  workouts to see if this helps with symptoms. -given handout -EKG not performed today as pt stated she had one recently done. -will obtain repeat labs and EKG if needed after receiving records.  History of migraine -resolved -discussed staying hydrated, getting plenty of rest, and limiting caffeine intake.  Blepharospasm -continue f/u with providers at [facility] q 3 months -continue clonazepam 0.5 mg as needed nightly.  Pt advised she would need to continue getting this med rx'd by her ophthalmologist. -Continue ritalin prn  Encounter to establish care -We reviewed the PMH, [place], FH, SH, Meds and Allergies. -We provided refills for any medications we will prescribe as needed. -We addressed current concerns per orders and patient instructions. -We have asked for records for pertinent exams,  studies, vaccines and notes from previous providers. -We have advised patient to follow up per instructions below.  Need for immunization against influenza  - Plan: Flu vaccine HIGH DOSE PF  F/u prn in 1 month  Grier Mitts, MD

## 2018-10-23 ENCOUNTER — Ambulatory Visit (INDEPENDENT_AMBULATORY_CARE_PROVIDER_SITE_OTHER): Payer: Medicare Other | Admitting: Family Medicine

## 2018-10-23 ENCOUNTER — Encounter: Payer: Self-pay | Admitting: Family Medicine

## 2018-10-23 VITALS — BP 118/78 | HR 64 | Temp 98.2°F | Wt 151.0 lb

## 2018-10-23 DIAGNOSIS — R42 Dizziness and giddiness: Secondary | ICD-10-CM

## 2018-10-23 MED ORDER — MECLIZINE HCL 25 MG PO TABS: 25.0000 mg | ORAL_TABLET | Freq: Three times a day (TID) | ORAL | 0 refills | Status: DC | PRN

## 2018-10-23 NOTE — Progress Notes (Signed)
Subjective:    Patient ID: Patricia Henry, female    DOB: 04-20-1951, 67 y.o.   MRN: [id number]  No chief complaint on file. pt accompanied by her husband.  HPI Patient was seen today for acute concern.  Pt endorses fatigue, dizziness, subjective fever, increased head pressure since Saturday.  Pt took aspirin x2 for her symptoms.  Pt endorses increased stress as she and her husband are moving.  Pt notes "feeling off" with bending forward and walking..  States woke up one day and had to go back to bed as she felt so bad.  Past Medical History:  Diagnosis Date  . Benign essential blepharospasm   . Hypertension    no meds  . IBS (irritable bowel syndrome)   . Migraines   . UTI (urinary tract infection)     No Known Allergies  ROS General: Denies fever, chills, night sweats, changes in weight, changes in appetite  +dizziness, fatigue HEENT: Denies headaches, ear pain, changes in vision, rhinorrhea, sore throat  +subjective fever, head pressure CV: Denies CP, palpitations, SOB, orthopnea Pulm: Denies SOB, cough, wheezing GI: Denies abdominal pain, nausea, vomiting, diarrhea, constipation GU: Denies dysuria, hematuria, frequency, vaginal discharge Msk: Denies muscle cramps, joint pains Neuro: Denies weakness, numbness, tingling Skin: Denies rashes, bruising Psych: Denies depression, anxiety, hallucinations    Objective:    Blood pressure 118/78, pulse 64, temperature 98.2 F (36.8 C), temperature source Oral, weight 151 lb (68.5 kg), SpO2 98 %.   Gen. Pleasant, well-nourished, in no distress, normal affect   HEENT: [place]/AT, face symmetric, conjunctiva clear, no scleral icterus, PERRLA, EOMI, no nystagmus, nares patent without drainage.  Dix-hallpike maneuver performed Lungs: no accessory muscle use, CTAB, no wheezes or rales Cardiovascular: RRR, no m/r/g, no peripheral edema Abdomen: BS present, soft, NT/ND, no hepatosplenomegaly. Musculoskeletal: No deformities, no cyanosis or  clubbing, normal tone Neuro:  A&Ox3, CN II-XII intact. Slower gait   Wt Readings from Last 3 Encounters:  10/23/18 151 lb (68.5 kg)  10/12/18 151 lb (68.5 kg)  07/06/18 150 lb (68 kg)    Lab Results  Component Value Date   TSH 2.740 10/23/2014   HGBA1C 5.3 10/23/2014    Assessment/Plan:  Vertigo -consent obtained.  Dix-hallpike maneuver perfomed.  Pt tolerated well  -discussed recent illness could have caused symptoms -pt given handout -encouraged to increase po hydration, decrease sodium intake, take time when moving from laying, sititng, and standing positions. - Plan: meclizine (ANTIVERT) 25 MG tablet  F/u prn  Grier Mitts, MD

## 2018-10-23 NOTE — Patient Instructions (Signed)
Vertigo °Vertigo is the feeling that you or your surroundings are moving when they are not. Vertigo can be dangerous if it occurs while you are doing something that could endanger you or others, such as driving. °What are the causes? °This condition is caused by a disturbance in the signals that are sent by your body’s sensory systems to your brain. Different causes of a disturbance can lead to vertigo, including: °· Infections, especially in the inner ear. °· A bad reaction to a drug, or misuse of alcohol and medicines. °· Withdrawal from drugs or alcohol. °· Quickly changing positions, as when lying down or rolling over in bed. °· Migraine headaches. °· Decreased blood flow to the brain. °· Decreased blood pressure. °· Increased pressure in the brain from a head or neck injury, stroke, infection, tumor, or bleeding. °· Central nervous system disorders. ° °What are the signs or symptoms? °Symptoms of this condition usually occur when you move your head or your eyes in different directions. Symptoms may start suddenly, and they usually last for less than a minute. Symptoms may include: °· Loss of balance and falling. °· Feeling like you are spinning or moving. °· Feeling like your surroundings are spinning or moving. °· Nausea and vomiting. °· Blurred vision or double vision. °· Difficulty hearing. °· Slurred speech. °· Dizziness. °· Involuntary eye movement (nystagmus). ° °Symptoms can be mild and cause only slight annoyance, or they can be severe and interfere with daily life. Episodes of vertigo may return (recur) over time, and they are often triggered by certain movements. Symptoms may improve over time. °How is this diagnosed? °This condition may be diagnosed based on medical history and the quality of your nystagmus. Your health care provider may test your eye movements by asking you to quickly change positions to trigger the nystagmus. This may be called the Dix-Hallpike test, head thrust test, or roll test.  You may be referred to a health care provider who specializes in ear, nose, and throat (ENT) problems (otolaryngologist) or a provider who specializes in disorders of the central nervous system (neurologist). °You may have additional testing, including: °· A physical exam. °· Blood tests. °· MRI. °· A CT scan. °· An electrocardiogram (ECG). This records electrical activity in your heart. °· An electroencephalogram (EEG). This records electrical activity in your brain. °· Hearing tests. ° °How is this treated? °Treatment for this condition depends on the cause and the severity of the symptoms. Treatment options include: °· Medicines to treat nausea or vertigo. These are usually used for severe cases. Some medicines that are used to treat other conditions may also reduce or eliminate vertigo symptoms. These include: °? Medicines that control allergies (antihistamines). °? Medicines that control seizures (anticonvulsants). °? Medicines that relieve depression (antidepressants). °? Medicines that relieve anxiety (sedatives). °· Head movements to adjust your inner ear back to normal. If your vertigo is caused by an ear problem, your health care provider may recommend certain movements to correct the problem. °· Surgery. This is rare. ° °Follow these instructions at home: °Safety °· Move slowly.Avoid sudden body or head movements. °· Avoid driving. °· Avoid operating heavy machinery. °· Avoid doing any tasks that would cause danger to you or others if you would have a vertigo episode during the task. °· If you have trouble walking or keeping your balance, try using a cane for stability. If you feel dizzy or unstable, sit down right away. °· Return to your normal activities as told by your   health care provider. Ask your health care provider what activities are safe for you. °General instructions °· Take over-the-counter and prescription medicines only as told by your health care provider. °· Avoid certain positions or  movements as told by your health care provider. °· Drink enough fluid to keep your urine clear or pale yellow. °· Keep all follow-up visits as told by your health care provider. This is important. °Contact a health care provider if: °· Your medicines do not relieve your vertigo or they make it worse. °· You have a fever. °· Your condition gets worse or you develop new symptoms. °· Your family or friends notice any behavioral changes. °· Your nausea or vomiting gets worse. °· You have numbness or a “pins and needles” sensation in part of your body. °Get help right away if: °· You have difficulty moving or speaking. °· You are always dizzy. °· You faint. °· You develop severe headaches. °· You have weakness in your hands, arms, or legs. °· You have changes in your hearing or vision. °· You develop a stiff neck. °· You develop sensitivity to light. °This information is not intended to replace advice given to you by your health care provider. Make sure you discuss any questions you have with your health care provider. °Document Released: 09/15/2005 Document Revised: 05/19/2016 Document Reviewed: 03/31/2015 °Elsevier Interactive Patient Education © 2018 [employer] ° °

## 2018-10-27 ENCOUNTER — Encounter: Payer: Self-pay | Admitting: Family Medicine

## 2018-11-07 ENCOUNTER — Emergency Department (HOSPITAL_BASED_OUTPATIENT_CLINIC_OR_DEPARTMENT_OTHER)
Admission: EM | Admit: 2018-11-07 | Discharge: 2018-11-07 | Disposition: A | Discharge status: Home / Self Care | Payer: Medicare Other | Attending: Emergency Medicine | Admitting: Emergency Medicine

## 2018-11-07 ENCOUNTER — Encounter (HOSPITAL_BASED_OUTPATIENT_CLINIC_OR_DEPARTMENT_OTHER): Payer: Self-pay

## 2018-11-07 DIAGNOSIS — N751 Abscess of Bartholin's gland: Secondary | ICD-10-CM | POA: Diagnosis not present

## 2018-11-07 DIAGNOSIS — L02214 Cutaneous abscess of groin: Secondary | ICD-10-CM | POA: Diagnosis not present

## 2018-11-07 DIAGNOSIS — Z87891 Personal history of nicotine dependence: Secondary | ICD-10-CM | POA: Diagnosis not present

## 2018-11-07 DIAGNOSIS — I10 Essential (primary) hypertension: Secondary | ICD-10-CM | POA: Insufficient documentation

## 2018-11-07 DIAGNOSIS — Z79899 Other long term (current) drug therapy: Secondary | ICD-10-CM | POA: Diagnosis not present

## 2018-11-07 MED ORDER — SULFAMETHOXAZOLE-TRIMETHOPRIM 800-160 MG PO TABS: 1.0000 | ORAL_TABLET | Freq: Two times a day (BID) | ORAL | 0 refills | Status: AC

## 2018-11-07 MED ORDER — ONDANSETRON HCL 8 MG PO TABS
4.0000 mg | ORAL_TABLET | Freq: Once | ORAL | Status: AC
  Administered 2018-11-07: 4 mg via ORAL
  Filled 2018-11-07: qty 1

## 2018-11-07 MED ORDER — LIDOCAINE-EPINEPHRINE 2 %-1:100000 IJ SOLN
20.0000 mL | Freq: Once | INTRAMUSCULAR | Status: DC
  Filled 2018-11-07: qty 20

## 2018-11-07 MED ORDER — LIDOCAINE-EPINEPHRINE 1 %-1:100000 IJ SOLN
INTRAMUSCULAR | Status: AC
  Administered 2018-11-07: 20 mL via INTRADERMAL
  Filled 2018-11-07: qty 1

## 2018-11-07 MED ORDER — LIDOCAINE-EPINEPHRINE 1 %-1:100000 IJ SOLN
20.0000 mL | Freq: Once | INTRAMUSCULAR | Status: AC
  Administered 2018-11-07: 20 mL via INTRADERMAL

## 2018-11-07 MED ORDER — SULFAMETHOXAZOLE-TRIMETHOPRIM 800-160 MG PO TABS
1.0000 | ORAL_TABLET | Freq: Once | ORAL | Status: AC
  Administered 2018-11-07: 1 via ORAL
  Filled 2018-11-07: qty 1

## 2018-11-07 MED ORDER — HYDROCODONE-ACETAMINOPHEN 5-325 MG PO TABS: 1.0000 | ORAL_TABLET | ORAL | 0 refills | Status: DC | PRN

## 2018-11-07 MED ORDER — PENTAFLUOROPROP-TETRAFLUOROETH EX AERO
INHALATION_SPRAY | CUTANEOUS | Status: AC | PRN
  Administered 2018-11-07: 1 via TOPICAL
  Filled 2018-11-07: qty 30

## 2018-11-07 MED ORDER — HYDROCODONE-ACETAMINOPHEN 5-325 MG PO TABS
1.0000 | ORAL_TABLET | Freq: Once | ORAL | Status: AC
  Administered 2018-11-07: 1 via ORAL
  Filled 2018-11-07: qty 1

## 2018-11-07 MED ORDER — ONDANSETRON HCL 4 MG PO TABS: 4.0000 mg | ORAL_TABLET | Freq: Three times a day (TID) | ORAL | 0 refills | Status: DC | PRN

## 2018-11-07 MED FILL — HYDROCODON-APAP 5-325: 5-325 | 2 days supply | Qty: 12 | Fill #0

## 2018-11-07 MED FILL — ONDANSETRON HCL 4 MG TABLET: 4 | 4 days supply | Qty: 12 | Fill #0

## 2018-11-07 MED FILL — SULFAMETHOXAZOLE-TMP DS TAB: 800-160 | 7 days supply | Qty: 14 | Fill #0

## 2018-11-07 NOTE — Discharge Instructions (Signed)
Your history and exam today were consistent with a Bartholin gland cyst/abscess.  We were able to drain multiple areas however the primary cyst had the purulence.  We placed the Word catheter in place and inflated it as far as we could due to discomfort.  Please keep it in place until you see the OB/GYN team and follow-up.  It may fall out as we are unable to inflated fully if this happens, it is okay.  Your wounds will continue to drain.  Please take the antibiotics twice a day for the next week to treat the infection.  Please watch for signs and symptoms of worsening infection.  Please stay hydrated.  Please use the pain medicine nausea medicine to help with discomfort.  If any symptoms are changing or worsening, please return to the nearest emergency department for reevaluation.

## 2018-11-07 NOTE — ED Notes (Signed)
Assisted EDP with I & D

## 2018-11-07 NOTE — ED Triage Notes (Signed)
Pt c/o abscess to vaginal area since Saturday, pain with heat and redness

## 2018-11-07 NOTE — ED Provider Notes (Signed)
[facility] EMERGENCY DEPARTMENT Provider Note   CSN: [id number] Arrival date & time: 11/07/18  5284     History   Chief Complaint Chief Complaint  Patient presents with  . Abscess    HPI Patricia Henry is a 67 y.o. female.  The history is provided by the patient and medical records. No language interpreter was used.  Abscess  Location:  Pelvis Pelvic abscess location:  Vulva Size:  4 Abscess quality: fluctuance, induration, painful, redness and warmth   Red streaking: no   Duration:  4 days Progression:  Worsening Pain details:    Quality:  Sharp   Severity:  Severe   Duration:  4 days   Timing:  Constant   Progression:  Worsening Chronicity:  New Context: not diabetes and not immunosuppression   Relieved by:  Nothing Worsened by:  Nothing Ineffective treatments:  Aspirin Associated symptoms: nausea   Associated symptoms: no fatigue, no fever, no headaches and no vomiting   Risk factors: prior abscess (20 years ago)     Past Medical History:  Diagnosis Date  . Benign essential blepharospasm   . Hypertension    no meds  . IBS (irritable bowel syndrome)   . Migraines   . UTI (urinary tract infection)     There are no active problems to display for this patient.   Past Surgical History:  Procedure Laterality Date  . BLEPHAROPLASTY    . EYE SURGERY  2014   SLK/ eyelid surgery/ Bil eyes  . ovary removed     right side/ over 20 years     OB History   None      Home Medications    Prior to Admission medications   Medication Sig Start Date End Date Taking? Authorizing Provider  Calcium Carb-Cholecalciferol (CALCIUM 600 + D PO) Take by mouth 2 (two) times daily.    [provider]  Cannabidiol 100 MG/ML SOLN Take by mouth. Take 6 drops tid    [provider]  clonazePAM (KLONOPIN) 0.5 MG tablet Take 0.5 mg by mouth as needed for anxiety.    [provider]  loperamide (IMODIUM) 2 MG capsule Take 1 mg by mouth.  Take half tablet daily    [provider]  meclizine (ANTIVERT) 25 MG tablet Take 1 tablet (25 mg total) by mouth 3 (three) times daily as needed for dizziness. 10/23/18   Billie Ruddy, MD  methylphenidate (RITALIN) 10 MG tablet Take 10 mg by mouth as needed.     [provider]  OnabotulinumtoxinA (BOTOX IJ) Inject as directed. Inject into eyelids every 90 days    [provider]  Phenylephrine HCl (AFRIN ALLERGY NA) Place into the nose as needed. Diluted with ocean nasal spray     [provider]    Family History Family History  Problem Relation Age of Onset  . Alzheimer's disease Mother   . Heart failure Mother   . Arthritis Mother   . Depression Mother   . Diabetes Mother   . Hearing loss Father   . Heart attack Father   . Arthritis Sister   . Hypertension Brother   . Arthritis Maternal Grandmother     Social History Social History   Tobacco Use  . Smoking status: Former Smoker    Last attempt to quit: 05/23/2013    Years since quitting: 5.4  . Smokeless tobacco: Never Used  Substance Use Topics  . Alcohol use: Yes    Alcohol/week: 7.0  standard drinks    Types: 7 Glasses of wine per week  . Drug use: No     Allergies   Patient has no known allergies.   Review of Systems Review of Systems  Constitutional: Positive for chills. Negative for diaphoresis, fatigue and fever.  HENT: Negative for congestion.   Respiratory: Negative for cough, chest tightness, shortness of breath and wheezing.   Cardiovascular: Negative for chest pain, palpitations and leg swelling.  Gastrointestinal: Positive for nausea. Negative for abdominal distention, constipation, diarrhea and vomiting.  Genitourinary: Positive for pelvic pain and vaginal pain. Negative for decreased urine volume, flank pain, frequency, hematuria, urgency, vaginal bleeding and vaginal discharge.  Musculoskeletal: Negative for back pain, neck pain and neck stiffness.  Skin:  Positive for rash.  Neurological: Negative for headaches.  Psychiatric/Behavioral: Negative for agitation.  All other systems reviewed and are negative.    Physical Exam Updated Vital Signs BP 129/90 (BP Location: Right Arm)   Pulse 83   Temp 98.1 F (36.7 C) (Oral)   Resp 16   Ht 5\' 7"  (1.702 m)   Wt 67.6 kg   SpO2 100%   BMI 23.34 kg/m   Physical Exam  Constitutional: She appears well-developed and well-nourished. No distress.  HENT:  Head: Normocephalic and atraumatic.  Eyes: Conjunctivae are normal.  Neck: Neck supple.  Cardiovascular: Normal rate and regular rhythm.  No murmur heard. Pulmonary/Chest: Effort normal and breath sounds normal. No respiratory distress.  Abdominal: Soft. Normal appearance and bowel sounds are normal. There is no tenderness. There is no rigidity, no rebound, no guarding and no CVA tenderness. Hernia confirmed negative in the right inguinal area and confirmed negative in the left inguinal area.  Genitourinary:    Pelvic exam was performed with patient supine. There is rash and tenderness on the right labia. There is no rash or tenderness on the left labia.  Musculoskeletal: She exhibits no edema.  Neurological: She is alert.  Skin: Skin is warm and dry.  Psychiatric: She has a normal mood and affect.  Nursing note and vitals reviewed.    ED Treatments / Results  Labs (all labs ordered are listed, but only abnormal results are displayed) Labs Reviewed  AEROBIC/ANAEROBIC CULTURE (SURGICAL/DEEP WOUND)    EKG None  Radiology No results found.  Procedures .Marland KitchenIncision and Drainage Date/Time: 11/07/2018 11:03 AM Performed by: Courtney Paris, MD Authorized by: Courtney Paris, MD   Consent:    Consent obtained:  Verbal   Consent given by:  Patient   Risks discussed:  Bleeding, incomplete drainage, infection, damage to other organs and pain   Alternatives discussed:  No treatment Location:    Type:  Bartholin  cyst   Size:  4   Location:  Anogenital   Anogenital location:  Bartholin's gland Pre-procedure details:    Skin preparation:  Chloraprep Anesthesia (see MAR for exact dosages):    Anesthesia method:  Local infiltration and topical application   Topical anesthesia: cold spray.   Local anesthetic:  Lidocaine 1% WITH epi Procedure type:    Complexity:  Complex Procedure details:    Needle aspiration: no     Incision types:  Single straight (3 straight incisions)   Incision depth:  Submucosal   Scalpel blade:  11   Wound management:  Probed and deloculated, irrigated with saline and extensive cleaning   Drainage:  Purulent and serosanguinous   Drainage amount:  Moderate   Wound treatment:  Wound left open   Packing materials:  Word catheter Post-procedure details:    Patient tolerance of procedure:  Tolerated well, no immediate complications Comments:     3 sites were drained with purulence coming out of the site of the Bartholin gland.  Ward catheter placed in the Bartholin gland site.  Pressure relieved from other sites after incision and drainage.   (including critical care time)  Medications Ordered in ED Medications  HYDROcodone-acetaminophen (NORCO/VICODIN) 5-325 MG per tablet 1 tablet (has no administration in time range)  ondansetron (ZOFRAN) tablet 4 mg (has no administration in time range)  sulfamethoxazole-trimethoprim (BACTRIM DS,SEPTRA DS) 800-160 MG per tablet 1 tablet (has no administration in time range)  pentafluoroprop-tetrafluoroeth (GEBAUERS) aerosol (1 application Topical Given by Other 11/07/18 0937)  lidocaine-EPINEPHrine (XYLOCAINE W/EPI) 1 %-1:100000 (with pres) injection 20 mL (20 mLs Intradermal Given by Other 11/07/18 6789)     Initial Impression / Assessment and Plan / ED Course  I have reviewed the triage vital signs and the nursing notes.  Pertinent labs & imaging results that were available during my care of the patient were reviewed by me and  considered in my medical decision making (see chart for details).     Patricia Henry is a 67 y.o. female with a past medical history significant for hypertension, IBS, and migraines who presents with right groin pain.  Patient reports that for the last 4 days, patient has had worsening pain, swelling, redness in her right vaginal area and right perineal area.  She reports that she had this happen 20 years ago and it drained on its own.  She reports no constipation or diarrhea.  She denies any urinary symptoms.  No vaginal discharge or vaginal bleeding.  She reports a mild chill and some nausea but no vomiting.  No congestion or cough.  No chest pain, palpitations, shortness of breath, or abdominal pain.  She is concerned that it is a cyst that turned into an infection.  She denies any history of surgery in this location.  On exam, a chaperone was present and a external GU exam was performed.  Patient has induration, erythema, fluctuance, and tenderness on the right side of the vagina.  Patient has what appears to be a Bartholin gland abscess that his tract more anteriorly and posteriorly.  There is tenderness and warmth present.  No vaginal discharge seen externally.  No abdominal tenderness or other groin tenderness.  Exam otherwise unremarkable.  Patient will have incision and drainage performed.  Patient will have colds were utilized and then lidocaine.  Patient will have purulence removed and will also have extensive flushing performed.  We will see if Word catheter would be possible based on size of area.  Anticipate antibiotics and discharge for OB/GYN/PCP follow-up.  Incision and drainage occurred without difficulty.  Patient had 3 sites that were incised and drained.  The Bartholin gland had all of the purulence and a Word catheter was placed after extensive cleaning.  Patient had relief of tension from the other sites as well.  No purulence was seen from the sites, just serosanguineous fluid.   Patient had improved discomfort after drainage.  Patient will follow-up with OB/GYN for removal of Word catheter and for follow-up.  Patient given a dose of pain medicine, nausea medicine, and antibiotics.  Patient given prescription for these at discharge.  Patient understood return precautions for any signs or symptoms of worsening infection.  Patient appeared well and was felt stable for discharge home.    Patient discharged in good condition  after treatment.    Final Clinical Impressions(s) / ED Diagnoses   Final diagnoses:  Abscess of groin, right  Bartholin's gland abscess    ED Discharge Orders         Ordered    sulfamethoxazole-trimethoprim (BACTRIM DS,SEPTRA DS) 800-160 MG tablet  2 times daily     11/07/18 1055    HYDROcodone-acetaminophen (NORCO/VICODIN) 5-325 MG tablet  Every 4 hours PRN     11/07/18 1055    ondansetron (ZOFRAN) 4 MG tablet  Every 8 hours PRN     11/07/18 1055          Clinical Impression: 1. Abscess of groin, right   2. Bartholin's gland abscess     Disposition: Discharge  Condition: Good  I have discussed the results, Dx and Tx plan with the pt(& family if present). He/she/they expressed understanding and agree(s) with the plan. Discharge instructions discussed at great length. Strict return precautions discussed and pt &/or family have verbalized understanding of the instructions. No further questions at time of discharge.    New Prescriptions   HYDROCODONE-ACETAMINOPHEN (NORCO/VICODIN) 5-325 MG TABLET    Take 1 tablet by mouth every 4 (four) hours as needed.   ONDANSETRON (ZOFRAN) 4 MG TABLET    Take 1 tablet (4 mg total) by mouth every 8 (eight) hours as needed.   SULFAMETHOXAZOLE-TRIMETHOPRIM (BACTRIM DS,SEPTRA DS) 800-160 MG TABLET    Take 1 tablet by mouth 2 (two) times daily for 7 days.    Follow Up: Billie Ruddy, MD [place] [address] [address] [place] [place] [phone] Schedule an appointment as soon as possible for a visit       , Gwenyth Allegra, MD 11/07/18 1106

## 2018-11-12 LAB — AEROBIC/ANAEROBIC CULTURE W GRAM STAIN (SURGICAL/DEEP WOUND): Special Requests: NORMAL

## 2018-11-12 LAB — AEROBIC/ANAEROBIC CULTURE (SURGICAL/DEEP WOUND)

## 2018-11-13 ENCOUNTER — Telehealth: Payer: Self-pay | Admitting: Emergency Medicine

## 2018-11-13 ENCOUNTER — Ambulatory Visit (INDEPENDENT_AMBULATORY_CARE_PROVIDER_SITE_OTHER): Payer: Medicare Other | Admitting: Obstetrics

## 2018-11-13 ENCOUNTER — Encounter: Payer: Self-pay | Admitting: Obstetrics

## 2018-11-13 VITALS — BP 131/78 | HR 66 | Ht 67.0 in | Wt 149.0 lb

## 2018-11-13 DIAGNOSIS — N751 Abscess of Bartholin's gland: Secondary | ICD-10-CM

## 2018-11-13 MED ORDER — AMOXICILLIN-POT CLAVULANATE 875-125 MG PO TABS: 1.0000 | ORAL_TABLET | Freq: Two times a day (BID) | ORAL | 0 refills | Status: DC

## 2018-11-13 NOTE — Progress Notes (Signed)
Patient ID: Patricia Henry, female   DOB: 1951-02-22, 67 y.o.   MRN: [id number]  Chief Complaint  Patient presents with  . Gynecologic Exam    HPI Patricia Henry is a 67 y.o. female.  Bartholin's Gland abscess, drained and Word Catheter placed last week but came out over the weekend.  Presents today for follow up.  No complaints. HPI  Past Medical History:  Diagnosis Date  . Benign essential blepharospasm   . Hypertension    no meds  . IBS (irritable bowel syndrome)   . Migraines   . UTI (urinary tract infection)     Past Surgical History:  Procedure Laterality Date  . BLEPHAROPLASTY    . EYE SURGERY  2014   SLK/ eyelid surgery/ Bil eyes  . ovary removed     right side/ over 20 years    Family History  Problem Relation Age of Onset  . Alzheimer's disease Mother   . Heart failure Mother   . Arthritis Mother   . Depression Mother   . Diabetes Mother   . Hearing loss Father   . Heart attack Father   . Arthritis Sister   . Hypertension Brother   . Arthritis Maternal Grandmother     Social History Social History   Tobacco Use  . Smoking status: Former Smoker    Last attempt to quit: 05/23/2013    Years since quitting: 5.4  . Smokeless tobacco: Never Used  Substance Use Topics  . Alcohol use: Yes    Alcohol/week: 7.0 standard drinks    Types: 7 Glasses of wine per week    Comment: week  . Drug use: No    No Known Allergies  Current Outpatient Medications  Medication Sig Dispense Refill  . Calcium Carb-Cholecalciferol (CALCIUM 600 + D PO) Take by mouth 2 (two) times daily.    . Cannabidiol 100 MG/ML SOLN Take by mouth. Take 6 drops tid    . clonazePAM (KLONOPIN) 0.5 MG tablet Take 0.5 mg by mouth as needed for anxiety.    Marland Kitchen loperamide (IMODIUM) 2 MG capsule Take 1 mg by mouth. Take half tablet daily    . OnabotulinumtoxinA (BOTOX IJ) Inject as directed. Inject into eyelids every 90 days    . Phenylephrine HCl (AFRIN ALLERGY NA) Place into the nose as needed.  Diluted with ocean nasal spray     . sulfamethoxazole-trimethoprim (BACTRIM DS,SEPTRA DS) 800-160 MG tablet Take 1 tablet by mouth 2 (two) times daily for 7 days. 14 tablet 0  . amoxicillin-clavulanate (AUGMENTIN) 875-125 MG tablet Take 1 tablet by mouth 2 (two) times daily. 14 tablet 0  . HYDROcodone-acetaminophen (NORCO/VICODIN) 5-325 MG tablet Take 1 tablet by mouth every 4 (four) hours as needed. 12 tablet 0  . meclizine (ANTIVERT) 25 MG tablet Take 1 tablet (25 mg total) by mouth 3 (three) times daily as needed for dizziness. 30 tablet 0  . methylphenidate (RITALIN) 10 MG tablet Take 10 mg by mouth as needed.     . ondansetron (ZOFRAN) 4 MG tablet Take 1 tablet (4 mg total) by mouth every 8 (eight) hours as needed. 12 tablet 0   No current facility-administered medications for this visit.     Review of Systems Review of Systems Constitutional: negative for fatigue and weight loss Respiratory: negative for cough and wheezing Cardiovascular: negative for chest pain, fatigue and palpitations Gastrointestinal: negative for abdominal pain and change in bowel habits Genitourinary:POSITIVE for right Bartholin's Abscess Integument/breast: negative for nipple discharge Musculoskeletal:negative  for myalgias Neurological: negative for gait problems and tremors Behavioral/Psych: negative for abusive relationship, depression Endocrine: negative for temperature intolerance      Blood pressure 131/78, pulse 66, height 5\' 7"  (1.702 m), weight 149 lb (67.6 kg).  Physical Exam Physical Exam                General:  Alert and no distress Pelvis:  External genitalia: mildly swollen and tender right Bartholin's Urinary system: urethral meatus normal and bladder without fullness, nontender Vaginal: normal without tenderness, induration or masses Cervix: normal appearance Adnexa: normal bimanual exam Uterus: anteverted and non-tender, normal size    50% of 20 min visit spent on counseling and  coordination of care.   Data Reviewed ER notes Medication  Assessment     1. Bartholin's gland abscess Rx: - amoxicillin-clavulanate (AUGMENTIN) 875-125 MG tablet; Take 1 tablet by mouth 2 (two) times daily.  Dispense: 14 tablet; Refill: 0    Plan    Follow up prn  No orders of the defined types were placed in this encounter.  Meds ordered this encounter  Medications  . amoxicillin-clavulanate (AUGMENTIN) 875-125 MG tablet    Sig: Take 1 tablet by mouth 2 (two) times daily.    Dispense:  14 tablet    Refill:  0    CHARLES A. HARPER MD 11-13-2018       Pt presents for follow up on Bartholin's abscess. Pt states that the drain fell out this past Saturday morning. She states that the area is still swollen and sore. She has one more day left on her antibiotic.

## 2018-11-13 NOTE — Telephone Encounter (Signed)
Post ED Visit - Positive Culture Follow-up  Culture report reviewed by antimicrobial stewardship pharmacist:  []  Elenor Quinones, Pharm.D. []  Heide Guile, Pharm.D., BCPS AQ-ID []  Parks Neptune, Pharm.D., BCPS []  Alycia Rossetti, Pharm.D., BCPS []  [place], Pharm.D., BCPS, AAHIVP []  Legrand Como, Pharm.D., BCPS, AAHIVP []  Salome Arnt, PharmD, BCPS []  Johnnette Gourd, PharmD, BCPS []  Hughes Better, PharmD, BCPS []  Leeroy Cha, PharmD [facility] PharmD  Positive wpund culture Treated with sulfamethoxazole-trimethoprim, organism sensitive to the same and no further patient follow-up is required at this time.  Hazle Nordmann 11/13/2018, 9:34 AM

## 2018-11-24 ENCOUNTER — Ambulatory Visit: Payer: Medicare Other | Admitting: Family Medicine

## 2018-12-01 DIAGNOSIS — H25813 Combined forms of age-related cataract, bilateral: Secondary | ICD-10-CM | POA: Diagnosis not present

## 2018-12-01 DIAGNOSIS — G245 Blepharospasm: Secondary | ICD-10-CM | POA: Diagnosis not present

## 2018-12-01 DIAGNOSIS — H43393 Other vitreous opacities, bilateral: Secondary | ICD-10-CM | POA: Diagnosis not present

## 2018-12-01 DIAGNOSIS — H02886 Meibomian gland dysfunction of left eye, unspecified eyelid: Secondary | ICD-10-CM | POA: Diagnosis not present

## 2018-12-01 DIAGNOSIS — H02883 Meibomian gland dysfunction of right eye, unspecified eyelid: Secondary | ICD-10-CM | POA: Diagnosis not present

## 2018-12-01 DIAGNOSIS — H04123 Dry eye syndrome of bilateral lacrimal glands: Secondary | ICD-10-CM | POA: Diagnosis not present

## 2019-01-24 DIAGNOSIS — G245 Blepharospasm: Secondary | ICD-10-CM | POA: Diagnosis not present

## 2019-02-27 DIAGNOSIS — Z1231 Encounter for screening mammogram for malignant neoplasm of breast: Secondary | ICD-10-CM | POA: Diagnosis not present

## 2019-04-25 DIAGNOSIS — G245 Blepharospasm: Secondary | ICD-10-CM | POA: Diagnosis not present

## 2019-07-11 DIAGNOSIS — D2271 Melanocytic nevi of right lower limb, including hip: Secondary | ICD-10-CM | POA: Diagnosis not present

## 2019-07-11 DIAGNOSIS — D2261 Melanocytic nevi of right upper limb, including shoulder: Secondary | ICD-10-CM | POA: Diagnosis not present

## 2019-07-11 DIAGNOSIS — L821 Other seborrheic keratosis: Secondary | ICD-10-CM | POA: Diagnosis not present

## 2019-07-11 DIAGNOSIS — D2262 Melanocytic nevi of left upper limb, including shoulder: Secondary | ICD-10-CM | POA: Diagnosis not present

## 2019-07-11 DIAGNOSIS — D1801 Hemangioma of skin and subcutaneous tissue: Secondary | ICD-10-CM | POA: Diagnosis not present

## 2019-07-11 DIAGNOSIS — D2362 Other benign neoplasm of skin of left upper limb, including shoulder: Secondary | ICD-10-CM | POA: Diagnosis not present

## 2019-07-11 DIAGNOSIS — L814 Other melanin hyperpigmentation: Secondary | ICD-10-CM | POA: Diagnosis not present

## 2019-07-11 DIAGNOSIS — L565 Disseminated superficial actinic porokeratosis (DSAP): Secondary | ICD-10-CM | POA: Diagnosis not present

## 2019-07-11 DIAGNOSIS — D225 Melanocytic nevi of trunk: Secondary | ICD-10-CM | POA: Diagnosis not present

## 2019-07-11 DIAGNOSIS — D2272 Melanocytic nevi of left lower limb, including hip: Secondary | ICD-10-CM | POA: Diagnosis not present

## 2019-07-11 DIAGNOSIS — L570 Actinic keratosis: Secondary | ICD-10-CM | POA: Diagnosis not present

## 2019-07-26 DIAGNOSIS — L821 Other seborrheic keratosis: Secondary | ICD-10-CM | POA: Diagnosis not present

## 2019-07-26 DIAGNOSIS — D485 Neoplasm of uncertain behavior of skin: Secondary | ICD-10-CM | POA: Diagnosis not present

## 2019-07-26 DIAGNOSIS — G245 Blepharospasm: Secondary | ICD-10-CM | POA: Diagnosis not present

## 2019-07-30 ENCOUNTER — Telehealth (INDEPENDENT_AMBULATORY_CARE_PROVIDER_SITE_OTHER): Payer: Medicare Other | Admitting: Family Medicine

## 2019-07-30 ENCOUNTER — Other Ambulatory Visit: Payer: Self-pay

## 2019-07-30 DIAGNOSIS — B351 Tinea unguium: Secondary | ICD-10-CM | POA: Diagnosis not present

## 2019-07-30 DIAGNOSIS — H6981 Other specified disorders of Eustachian tube, right ear: Secondary | ICD-10-CM | POA: Diagnosis not present

## 2019-07-30 NOTE — Progress Notes (Signed)
Virtual Visit via Video Note  I connected with Patricia Henry on 07/30/19 at  8:30 AM EDT by a video enabled telemedicine application 2/2 [id number]-32 pandemic and verified that I am speaking with the correct person using two identifiers.  Location patient: home Location provider:work or home office Persons participating in the virtual visit: patient, provider  I discussed the limitations of evaluation and management by telemedicine and the availability of in person appointments. The patient expressed understanding and agreed to proceed.   HPI: Pt with the feeling of her R ear being "stopped up".  Feeling is intermittent.  Also with popping.  Tried OTC drops for "swimmer's ear".  Using flonase and netti pot.  Denies HA, fever, chills, facial edema.  Has a h/o seasonal allergies.  Pt also notes toe nail fungus.  States occurred in the past, used OTC meds x 1 yr which helped.  Inquires about which OTC med to use.  ROS: See pertinent positives and negatives per HPI.  Past Medical History:  Diagnosis Date  . Benign essential blepharospasm   . Hypertension    no meds  . IBS (irritable bowel syndrome)   . Migraines   . UTI (urinary tract infection)     Past Surgical History:  Procedure Laterality Date  . BLEPHAROPLASTY    . EYE SURGERY  2014   SLK/ eyelid surgery/ Bil eyes  . ovary removed     right side/ over 20 years    Family History  Problem Relation Age of Onset  . Alzheimer's disease Mother   . Heart failure Mother   . Arthritis Mother   . Depression Mother   . Diabetes Mother   . Hearing loss Father   . Heart attack Father   . Arthritis Sister   . Hypertension Brother   . Arthritis Maternal Grandmother     Current Outpatient Medications:  .  amoxicillin-clavulanate (AUGMENTIN) 875-125 MG tablet, Take 1 tablet by mouth 2 (two) times daily., Disp: 14 tablet, Rfl: 0 .  Calcium Carb-Cholecalciferol (CALCIUM 600 + D PO), Take by mouth 2 (two) times daily., Disp: , Rfl:  .   Cannabidiol 100 MG/ML SOLN, Take by mouth. Take 6 drops tid, Disp: , Rfl:  .  clonazePAM (KLONOPIN) 0.5 MG tablet, Take 0.5 mg by mouth as needed for anxiety., Disp: , Rfl:  .  HYDROcodone-acetaminophen (NORCO/VICODIN) 5-325 MG tablet, Take 1 tablet by mouth every 4 (four) hours as needed., Disp: 12 tablet, Rfl: 0 .  loperamide (IMODIUM) 2 MG capsule, Take 1 mg by mouth. Take half tablet daily, Disp: , Rfl:  .  meclizine (ANTIVERT) 25 MG tablet, Take 1 tablet (25 mg total) by mouth 3 (three) times daily as needed for dizziness., Disp: 30 tablet, Rfl: 0 .  methylphenidate (RITALIN) 10 MG tablet, Take 10 mg by mouth as needed. , Disp: , Rfl:  .  OnabotulinumtoxinA (BOTOX IJ), Inject as directed. Inject into eyelids every 90 days, Disp: , Rfl:  .  ondansetron (ZOFRAN) 4 MG tablet, Take 1 tablet (4 mg total) by mouth every 8 (eight) hours as needed., Disp: 12 tablet, Rfl: 0 .  Phenylephrine HCl (AFRIN ALLERGY NA), Place into the nose as needed. Diluted with ocean nasal spray , Disp: , Rfl:   EXAM:  VITALS per patient if applicable: RR between 35-57 bpm  GENERAL: alert, oriented, appears well and in no acute distress  HEENT: wearing glasses, atraumatic, conjunctiva clear, no obvious abnormalities on inspection of external nose and ears  NECK:  normal movements of the head and neck  LUNGS: on inspection no signs of respiratory distress, breathing rate appears normal, no obvious gross SOB, gasping or wheezing  CV: no obvious cyanosis  MS: moves all visible extremities without noticeable abnormality  PSYCH/NEURO: pleasant and cooperative, no obvious depression or anxiety, speech and thought processing grossly intact  ASSESSMENT AND PLAN:  Discussed the following assessment and plan:  Dysfunction of right eustachian tube -continue supportive care: flonase and Netti pot. -discussed proper nasal spray use -will restart OTC allergy med  -f/u in 1 wks for continued  symptoms  Onychomycosis -discussed likely duration of treatment -Pt to obtain OTC med  -given precautions  F/u prn for eustachian tube dysfunction   I discussed the assessment and treatment plan with the patient. The patient was provided an opportunity to ask questions and all were answered. The patient agreed with the plan and demonstrated an understanding of the instructions.   The patient was advised to call back or seek an in-person evaluation if the symptoms worsen or if the condition fails to improve as anticipated.    Billie Ruddy, MD

## 2019-10-25 ENCOUNTER — Ambulatory Visit (INDEPENDENT_AMBULATORY_CARE_PROVIDER_SITE_OTHER): Payer: Medicare Other

## 2019-10-25 ENCOUNTER — Other Ambulatory Visit: Payer: Self-pay

## 2019-10-25 DIAGNOSIS — Z23 Encounter for immunization: Secondary | ICD-10-CM

## 2019-10-29 DIAGNOSIS — G245 Blepharospasm: Secondary | ICD-10-CM | POA: Diagnosis not present

## 2020-01-07 ENCOUNTER — Ambulatory Visit (HOSPITAL_COMMUNITY)
Admission: EM | Admit: 2020-01-07 | Discharge: 2020-01-07 | Disposition: A | Discharge status: Home / Self Care | Payer: Medicare Other | Attending: Family Medicine | Admitting: Family Medicine

## 2020-01-07 ENCOUNTER — Encounter (HOSPITAL_COMMUNITY): Payer: Self-pay

## 2020-01-07 ENCOUNTER — Other Ambulatory Visit: Payer: Self-pay

## 2020-01-07 DIAGNOSIS — N3000 Acute cystitis without hematuria: Secondary | ICD-10-CM

## 2020-01-07 LAB — POCT URINALYSIS DIP (DEVICE)
Bilirubin Urine: NEGATIVE
Glucose, UA: NEGATIVE mg/dL
Nitrite: NEGATIVE
Protein, ur: NEGATIVE mg/dL
Specific Gravity, Urine: >=1.03 (ref 1.005–1.030)
Urobilinogen, UA: 0.2 mg/dL (ref 0.0–1.0)
pH: 6.5 (ref 5.0–8.0)

## 2020-01-07 MED ORDER — CEPHALEXIN 500 MG PO CAPS: 500.0000 mg | ORAL_CAPSULE | Freq: Two times a day (BID) | ORAL | 0 refills | Status: AC

## 2020-01-07 NOTE — ED Provider Notes (Signed)
[place]    CSN: [id number] Arrival date & time: 01/07/20  1727      History   Chief Complaint Chief Complaint  Patient presents with  . Urinary Tract Infection    HPI Patricia Henry is a 69 y.o. female.   Patricia Henry presents with complaints of frequency, urgency and burning with urination which started over the past few days. Took an OTC Azo UTI test which came back positive. Feels similar to previous uti's she has had in the past. Has been at least a year since her last. No previous known antibiotic resistance to UTI's. No abdominal pain, no back pain. No blood to urine. No other gi symptoms.   ROS per HPI, negative if not otherwise mentioned.      Past Medical History:  Diagnosis Date  . Benign essential blepharospasm   . Hypertension    no meds  . IBS (irritable bowel syndrome)   . Migraines   . UTI (urinary tract infection)     There are no problems to display for this patient.   Past Surgical History:  Procedure Laterality Date  . BLEPHAROPLASTY    . EYE SURGERY  2014   SLK/ eyelid surgery/ Bil eyes  . ovary removed     right side/ over 20 years    OB History    Gravida  1   Para      Term      Preterm      AB  1   Living  0     SAB      TAB  1   Ectopic      Multiple      Live Births  0            Home Medications    Prior to Admission medications   Medication Sig Start Date End Date Taking? Authorizing Provider  Calcium Carb-Cholecalciferol (CALCIUM 600 + D PO) Take by mouth 2 (two) times daily.    [provider]  Cannabidiol 100 MG/ML SOLN Take by mouth. Take 6 drops tid    [provider]  cephALEXin (KEFLEX) 500 MG capsule Take 1 capsule (500 mg total) by mouth 2 (two) times daily for 7 days. 01/07/20 01/14/20  Zigmund Gottron, NP  clonazePAM (KLONOPIN) 0.5 MG tablet Take 0.5 mg by mouth as needed for anxiety.    [provider]  HYDROcodone-acetaminophen (NORCO/VICODIN) 5-325  MG tablet Take 1 tablet by mouth every 4 (four) hours as needed. 11/07/18   Tegeler, Gwenyth Allegra, MD  loperamide (IMODIUM) 2 MG capsule Take 1 mg by mouth. Take half tablet daily    [provider]  meclizine (ANTIVERT) 25 MG tablet Take 1 tablet (25 mg total) by mouth 3 (three) times daily as needed for dizziness. 10/23/18   Billie Ruddy, MD  methylphenidate (RITALIN) 10 MG tablet Take 10 mg by mouth as needed.     [provider]  OnabotulinumtoxinA (BOTOX IJ) Inject as directed. Inject into eyelids every 90 days    [provider]  ondansetron (ZOFRAN) 4 MG tablet Take 1 tablet (4 mg total) by mouth every 8 (eight) hours as needed. 11/07/18   Tegeler, Gwenyth Allegra, MD  Phenylephrine HCl (AFRIN ALLERGY NA) Place into the nose as needed. Diluted with ocean nasal spray     [provider]    Family History Family History  Problem Relation Age of Onset  . Alzheimer's disease Mother   .  Heart failure Mother   . Arthritis Mother   . Depression Mother   . Diabetes Mother   . Hearing loss Father   . Heart attack Father   . Arthritis Sister   . Hypertension Brother   . Arthritis Maternal Grandmother     Social History Social History   Tobacco Use  . Smoking status: Former Smoker    Quit date: 05/23/2013    Years since quitting: 6.6  . Smokeless tobacco: Never Used  Substance Use Topics  . Alcohol use: Yes    Alcohol/week: 7.0 standard drinks    Types: 7 Glasses of wine per week    Comment: week  . Drug use: No     Allergies   Patient has no known allergies.   Review of Systems Review of Systems   Physical Exam Triage Vital Signs ED Triage Vitals  Enc Vitals Group     BP 01/07/20 1805 (!) 174/98     Pulse Rate 01/07/20 1805 98     Resp 01/07/20 1805 16     Temp 01/07/20 1805 98 F (36.7 C)     Temp src --      SpO2 01/07/20 1805 98 %     Weight --      Height --      Head Circumference --      Peak Flow --      Pain  Score 01/07/20 1806 0     Pain Loc --      Pain Edu? --      Excl. in [place]? --    No data found.  Updated Vital Signs BP (!) 174/98 (BP Location: Right Arm)   Pulse 98   Temp 98 F (36.7 C)   Resp 16   SpO2 98%       Physical Exam Constitutional:      General: She is not in acute distress.    Appearance: She is well-developed.  Cardiovascular:     Rate and Rhythm: Normal rate.  Pulmonary:     Effort: Pulmonary effort is normal.  Abdominal:     Tenderness: There is no abdominal tenderness. There is no right CVA tenderness or left CVA tenderness.  Skin:    General: Skin is warm and dry.  Neurological:     Mental Status: She is alert and oriented to person, place, and time.      UC Treatments / Results  Labs (all labs ordered are listed, but only abnormal results are displayed) Labs Reviewed  POCT URINALYSIS DIP (DEVICE) - Abnormal; Notable for the following components:      Result Value   Ketones, ur TRACE (*)    Hgb urine dipstick TRACE (*)    Leukocytes,Ua SMALL (*)    All other components within normal limits  URINE CULTURE    EKG   Radiology No results found.  Procedures Procedures (including critical care time)  Medications Ordered in UC Medications - No data to display  Initial Impression / Assessment and Plan / UC Course  I have reviewed the triage vital signs and the nursing notes.  Pertinent labs & imaging results that were available during my care of the patient were reviewed by me and considered in my medical decision making (see chart for details).     Non toxic. Benign physical exam.  Noted htn, asymptomatic. Urine with leuks and hgb, symptomatic of uti, keflex initiated with culture pending. Return precautions provided. Patient verbalized understanding and agreeable to plan.  Final Clinical Impressions(s) / UC Diagnoses   Final diagnoses:  Acute cystitis without hematuria     Discharge Instructions     Your urine is suspicious  for UTI, I have sent it to be cultured to confirm this finding and confirm appropriate antibiotics.  Drink plenty of water to empty bladder regularly. Avoid alcohol and caffeine as these may irritate the bladder.  Complete course of antibiotics.  If symptoms worsen or do not improve in the next week to return to be seen or to follow up with your PCP.     ED Prescriptions    Medication Sig Dispense Auth. Provider   cephALEXin (KEFLEX) 500 MG capsule Take 1 capsule (500 mg total) by mouth 2 (two) times daily for 7 days. 14 capsule Zigmund Gottron, NP     PDMP not reviewed this encounter.   Zigmund Gottron, NP 01/07/20 1827

## 2020-01-07 NOTE — Discharge Instructions (Signed)
Your urine is suspicious for UTI, I have sent it to be cultured to confirm this finding and confirm appropriate antibiotics.  Drink plenty of water to empty bladder regularly. Avoid alcohol and caffeine as these may irritate the bladder.  Complete course of antibiotics.  If symptoms worsen or do not improve in the next week to return to be seen or to follow up with your PCP.

## 2020-01-07 NOTE — ED Triage Notes (Signed)
Pt C/O urinary frequency with odor and tingling sensation after urinating. Symptom started over the weekend

## 2020-01-09 LAB — URINE CULTURE: Culture: >=100000 — AB

## 2020-01-14 DIAGNOSIS — H02534 Eyelid retraction left upper eyelid: Secondary | ICD-10-CM | POA: Diagnosis not present

## 2020-01-14 DIAGNOSIS — H02531 Eyelid retraction right upper eyelid: Secondary | ICD-10-CM | POA: Diagnosis not present

## 2020-01-14 DIAGNOSIS — G245 Blepharospasm: Secondary | ICD-10-CM | POA: Diagnosis not present

## 2020-01-14 DIAGNOSIS — H04123 Dry eye syndrome of bilateral lacrimal glands: Secondary | ICD-10-CM | POA: Diagnosis not present

## 2020-01-28 DIAGNOSIS — G245 Blepharospasm: Secondary | ICD-10-CM | POA: Diagnosis not present

## 2020-03-26 DIAGNOSIS — Z1231 Encounter for screening mammogram for malignant neoplasm of breast: Secondary | ICD-10-CM | POA: Diagnosis not present

## 2020-03-26 LAB — HM MAMMOGRAPHY

## 2020-03-28 ENCOUNTER — Encounter: Payer: Self-pay | Admitting: Family Medicine

## 2020-04-08 ENCOUNTER — Telehealth: Payer: Self-pay | Admitting: Family Medicine

## 2020-04-08 NOTE — Telephone Encounter (Signed)
Noted  

## 2020-04-08 NOTE — Telephone Encounter (Signed)
The patient called wanting to know if we received a request from Dr. Ramonita Lab from [facility] in [place] [phone].  Dr. Toy Cookey wants to know if it is okay for her to prescribe the patient Clonazapam for her eye surgery or does Dr. Volanda Napoleon want to see the patient to prescribe the medication to the patient?  The patient don't know if Dr. Toy Cookey faxed over a request or how the doctors communicate.  I contacted the patient and verified our fax number to give to the doctor and she is going to have them to re-fax the request.  Please advise

## 2020-04-12 ENCOUNTER — Encounter: Payer: Self-pay | Admitting: Family Medicine

## 2020-04-15 ENCOUNTER — Telehealth (INDEPENDENT_AMBULATORY_CARE_PROVIDER_SITE_OTHER): Payer: Medicare Other | Admitting: Family Medicine

## 2020-04-15 ENCOUNTER — Encounter: Payer: Self-pay | Admitting: Family Medicine

## 2020-04-15 DIAGNOSIS — G245 Blepharospasm: Secondary | ICD-10-CM

## 2020-04-15 DIAGNOSIS — F4329 Adjustment disorder with other symptoms: Secondary | ICD-10-CM | POA: Diagnosis not present

## 2020-04-15 MED ORDER — CLONAZEPAM 0.5 MG PO TABS: 0.5000 mg | ORAL_TABLET | Freq: Two times a day (BID) | ORAL | 0 refills | Status: AC | PRN

## 2020-04-15 NOTE — Progress Notes (Signed)
Virtual Visit via Video Note  I connected with Patricia Henry on 04/15/20 at  2:00 PM EDT by a video enabled telemedicine application 2/2 [id number] pandemic and verified that I am speaking with the correct person using two identifiers.  Location patient: home Location provider:work or home office Persons participating in the virtual visit: patient, provider  I discussed the limitations of evaluation and management by telemedicine and the availability of in person appointments. The patient expressed understanding and agreed to proceed.   HPI: Pt is a 69 yo female with pmh sig for b/l blepharopasms, migraines, HTN, IBS who is seen for acute concern.  Pt states she is feeling "overwhelmed and broken".   John, pt's husband, recently dx'd with terminal brain cancer.  He had a h/o prostate cancer w/ mets.  He is not longer able to drive 2/2 balance issues.  Pt trying to take care of things for her husband.  Pt is trying to find a contractor to put up grab bars in the home.  She endorses having a will, HCPOA, POA in place.  Pt will see her therapist, Horris Latino, tomorrow .  Pt has not been able to sleep as her husband is on a steroid three times/day which keeps him up all night.    Pt recently saw a new Ophthamologist for bilateral blepharospasms as her previous provider who is a Careers information officer moved out of state.  Her new provider is Dr. Toy Cookey at [facility], wanted to speak with this provider.   Pt has received Clonazapam and botox per Ophtho x yrs.  Was on ritalin for it, but did not like the S/Es- felt like. Next appt is May 11.  Stress and sunlight made patient's blepharospasms worse.  ROS: See pertinent positives and negatives per HPI.  Past Medical History:  Diagnosis Date  . Benign essential blepharospasm   . Hypertension    no meds  . IBS (irritable bowel syndrome)   . Migraines   . UTI (urinary tract infection)     Past Surgical History:  Procedure Laterality Date  . BLEPHAROPLASTY    .  EYE SURGERY  2014   SLK/ eyelid surgery/ Bil eyes  . ovary removed     right side/ over 20 years    Family History  Problem Relation Age of Onset  . Alzheimer's disease Mother   . Heart failure Mother   . Arthritis Mother   . Depression Mother   . Diabetes Mother   . Hearing loss Father   . Heart attack Father   . Arthritis Sister   . Hypertension Brother   . 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  Current Outpatient Medications:  .  Calcium Carb-Cholecalciferol (CALCIUM 600 + D PO), Take by mouth 2 (two) times daily., Disp: , Rfl:  .  Cannabidiol 100 MG/ML SOLN, Take by mouth. Take 6 drops tid, Disp: , Rfl:  .  clonazePAM (KLONOPIN) 0.5 MG tablet, Take 0.5 mg by mouth as needed for anxiety., Disp: , Rfl:  .  loperamide (IMODIUM) 2 MG capsule, Take 1 mg by mouth. Take half tablet daily, Disp: , Rfl:  .  methylphenidate (RITALIN) 10 MG tablet, Take 10 mg by mouth as needed. , Disp: , Rfl:  .  OnabotulinumtoxinA (BOTOX IJ), Inject as directed. Inject into eyelids every 90 days, Disp: , Rfl:  .  Phenylephrine HCl (AFRIN ALLERGY NA), Place into the nose as needed.  Diluted with ocean nasal spray , Disp: , Rfl:  .  HYDROcodone-acetaminophen (NORCO/VICODIN) 5-325 MG tablet, Take 1 tablet by mouth every 4 (four) hours as needed., Disp: 12 tablet, Rfl: 0 .  meclizine (ANTIVERT) 25 MG tablet, Take 1 tablet (25 mg total) by mouth 3 (three) times daily as needed for dizziness., Disp: 30 tablet, Rfl: 0 .  ondansetron (ZOFRAN) 4 MG tablet, Take 1 tablet (4 mg total) by mouth every 8 (eight) hours as needed., Disp: 12 tablet, Rfl: 0  EXAM:  VITALS per patient if applicable: RR between 123456 bpm  GENERAL: alert, oriented, appears anxious, tearful at times and in no acute distress  HEENT: atraumatic, conjunctiva clear, no obvious abnormalities on inspection of external nose and ears  NECK: normal movements of  the head and neck  LUNGS: on inspection no signs of respiratory distress, breathing rate appears normal, no obvious gross SOB, gasping or wheezing  CV: no obvious cyanosis  MS: moves all visible extremities without noticeable abnormality  PSYCH/NEURO: pleasant and cooperative, no obvious depression or anxiety, speech and thought processing grossly intact  ASSESSMENT AND PLAN:  Discussed the following assessment and plan:  Adjustment disorder with other symptoms  -Increased stress 2/2 recent terminal diagnosis of patient's husband -Continue therapy and support group -Discussed self-care, however patient is focused on her husband at this time - Plan: clonazePAM (KLONOPIN) 0.5 MG tablet  Blepharospasm of both eyes  -Followed by neuro ocular specialist in the past who prescribed clonazepam. -Would prefer patient specialist to continue prescribing as they follow her for this condition. -Discussed peer to peer conversation with patient's ophthalmologist if needed -Given recent increased stress a/w pt's husband's dx will send in a limited 1 month supply of Klonopin -PDMP reviewed - Plan: clonazePAM (KLONOPIN) 0.5 MG tablet  Follow-up in 2-4 weeks, sooner if needed   I discussed the assessment and treatment plan with the patient. The patient was provided an opportunity to ask questions and all were answered. The patient agreed with the plan and demonstrated an understanding of the instructions.   The patient was advised to call back or seek an in-person evaluation if the symptoms worsen or if the condition fails to improve as anticipated.  I provided 30 minutes of non-face-to-face time during this encounter.   Billie Ruddy, MD

## 2020-04-19 ENCOUNTER — Encounter: Payer: Self-pay | Admitting: Family Medicine

## 2020-04-22 ENCOUNTER — Telehealth: Payer: Self-pay | Admitting: Family Medicine

## 2020-04-22 NOTE — Telephone Encounter (Signed)
Dr. Toy Cookey called by this provider.  Discussed the use of clonazepam 0.5 mg for pt's h/o blephorospasm.  Dr. Toy Cookey ok with continuing historic med.  Advised pt given a 1 month supply at recent e-visit.

## 2020-04-22 NOTE — Telephone Encounter (Signed)
Ramonita Lab from [facility] Is wanting to discuss the patients Rx for clonazePAM (KLONOPIN) 0.5 MG tablet  She treats her for Botox around her eyes.  Ramonita Lab [phone]

## 2020-04-22 NOTE — Telephone Encounter (Signed)
Spoke with Dr Toy Cookey state that  she wanted to have a telephone discussion with you regarding pt medication listed on the message, ok to call her on her Cell phone below

## 2020-04-25 NOTE — Telephone Encounter (Signed)
Spoke with pt advised that pt chart shows that pt  had a MyChart Video visit with Dr Volanda Napoleon on 04/15/2020, pt verbalized understanding

## 2020-04-29 DIAGNOSIS — G245 Blepharospasm: Secondary | ICD-10-CM | POA: Diagnosis not present

## 2020-05-09 ENCOUNTER — Telehealth (INDEPENDENT_AMBULATORY_CARE_PROVIDER_SITE_OTHER): Payer: Medicare Other | Admitting: Family Medicine

## 2020-05-09 ENCOUNTER — Encounter: Payer: Self-pay | Admitting: Family Medicine

## 2020-05-09 ENCOUNTER — Telehealth: Payer: Medicare Other | Admitting: Family Medicine

## 2020-05-09 DIAGNOSIS — G245 Blepharospasm: Secondary | ICD-10-CM | POA: Diagnosis not present

## 2020-05-09 DIAGNOSIS — F4329 Adjustment disorder with other symptoms: Secondary | ICD-10-CM | POA: Diagnosis not present

## 2020-05-09 NOTE — Progress Notes (Signed)
Virtual Visit via Video Note  I connected with Patricia Henry on 05/09/20 at  9:00 AM EDT by a video enabled telemedicine application 2/2 [id number] pandemic and verified that I am speaking with the correct person using two identifiers.  Location patient: home Location provider:work or home office Persons participating in the virtual visit: patient, provider  I discussed the limitations of evaluation and management by telemedicine and the availability of in person appointments. The patient expressed understanding and agreed to proceed.   HPI: Pt is a 69 yo with pmh sig for benign essential blepharospasm, IBS, h/o migraines, HTN seen for f/u.  Pt's husband terminally ill 2/2 prostate ca with brain mets.  Pt went to [place] with her husband last weekend.  They had an amazing time with 10 of their vaccinated friends.  Pt's husband developed bacterial pneumonia after the trip.  He is on abx and slowly improving.  Pt's sister is coming from [place] next wk to help out.  Working with Dr. Pattricia Boss for therapy.  Had a session on Monday.  Seen by Ophthalmology, Dr. Ramonita Lab.  Pt previously rx'd clonazepam 0.5 mg for blepharospasm by historic provider.  Pt nervous about taking clonazepam after hearing it can cause memory deficits in older women.  Pt's mom had Alzheimer's dz.  Pt trying to decrease stress which reduces blepharospam.  Driving at times triggers blepharospasm, which then causes tearing of eye.  Also using botox to treat blepharospasm.   Pt found a contractor to put grab bars in shower.  ROS: See pertinent positives and negatives per HPI.  Past Medical History:  Diagnosis Date  . Benign essential blepharospasm   . Hypertension    no meds  . IBS (irritable bowel syndrome)   . Migraines   . UTI (urinary tract infection)     Past Surgical History:  Procedure Laterality Date  . BLEPHAROPLASTY    . EYE SURGERY  2014   SLK/ eyelid surgery/ Bil eyes  . ovary removed     right side/  over 20 years    Family History  Problem Relation Age of Onset  . Alzheimer's disease Mother   . Heart failure Mother   . Arthritis Mother   . Depression Mother   . Diabetes Mother   . Hearing loss Father   . Heart attack Father   . Arthritis Sister   . Hypertension Brother   . Arthritis Maternal Grandmother      Current Outpatient Medications:  .  Calcium Carb-Cholecalciferol (CALCIUM 600 + D PO), Take by mouth 2 (two) times daily., Disp: , Rfl:  .  Cannabidiol 100 MG/ML SOLN, Take by mouth. Take 6 drops tid, Disp: , Rfl:  .  clonazePAM (KLONOPIN) 0.5 MG tablet, Take 0.5 mg by mouth as needed for anxiety., Disp: , Rfl:  .  clonazePAM (KLONOPIN) 0.5 MG tablet, Take 1 tablet (0.5 mg total) by mouth 2 (two) times daily as needed for anxiety., Disp: 60 tablet, Rfl: 0 .  HYDROcodone-acetaminophen (NORCO/VICODIN) 5-325 MG tablet, Take 1 tablet by mouth every 4 (four) hours as needed., Disp: 12 tablet, Rfl: 0 .  loperamide (IMODIUM) 2 MG capsule, Take 1 mg by mouth. Take half tablet daily, Disp: , Rfl:  .  meclizine (ANTIVERT) 25 MG tablet, Take 1 tablet (25 mg total) by mouth 3 (three) times daily as needed for dizziness., Disp: 30 tablet, Rfl: 0 .  methylphenidate (RITALIN) 10 MG tablet, Take 10 mg by mouth as needed. , Disp: , Rfl:  .  OnabotulinumtoxinA (BOTOX IJ), Inject as directed. Inject into eyelids every 90 days, Disp: , Rfl:  .  ondansetron (ZOFRAN) 4 MG tablet, Take 1 tablet (4 mg total) by mouth every 8 (eight) hours as needed., Disp: 12 tablet, Rfl: 0 .  Phenylephrine HCl (AFRIN ALLERGY NA), Place into the nose as needed. Diluted with ocean nasal spray , Disp: , Rfl:   EXAM:  VITALS per patient if applicable:  RR between 12-20 bpm  GENERAL: alert, oriented, appears well and in no acute distress. Mildly depressed mood at times, but mostly upbeat and hopeful.    HEENT: atraumatic, conjunctiva clear, no obvious abnormalities on inspection of external nose and ears  NECK:  normal movements of the head and neck  LUNGS: on inspection no signs of respiratory distress, breathing rate appears normal, no obvious gross SOB, gasping or wheezing  CV: no obvious cyanosis  MS: moves all visible extremities without noticeable abnormality  PSYCH/NEURO: pleasant and cooperative, no obvious depression or anxiety, speech and thought processing grossly intact  ASSESSMENT AND PLAN:  Discussed the following assessment and plan:  Adjustment disorder with other symptoms -Stable -Continue therapy with Dr. Pattricia Boss -Continue support group -Continue to find ways to reduce stress and spend time with husband.  Blepharospasm of both eyes -Continue clonazepam 0.5 mg as needed.  This provider discussed use with patient's ophthalmologist Dr. Ramonita Lab on 04/22/20 who is ok continuing historic med at current dose.  Discussed with pt continue using sparingly.   Will work towards using alternative meds in the future. -Continue Botox and eyelids q 90 days -Continue to con reducing stress -Continue follow-up with Dr. Ramonita Lab, ophthalmology [facility]  F/u in 4-6 wks as needed.   I discussed the assessment and treatment plan with the patient. The patient was provided an opportunity to ask questions and all were answered. The patient agreed with the plan and demonstrated an understanding of the instructions.   The patient was advised to call back or seek an in-person evaluation if the symptoms worsen or if the condition fails to improve as anticipated.  I provided 25 minutes of non-face-to-face time during this encounter.   Billie Ruddy, MD

## 2020-05-16 ENCOUNTER — Encounter: Payer: Self-pay | Admitting: Family Medicine

## 2020-05-16 ENCOUNTER — Telehealth (INDEPENDENT_AMBULATORY_CARE_PROVIDER_SITE_OTHER): Payer: Medicare Other | Admitting: Family Medicine

## 2020-05-16 DIAGNOSIS — F4329 Adjustment disorder with other symptoms: Secondary | ICD-10-CM | POA: Diagnosis not present

## 2020-05-16 DIAGNOSIS — G245 Blepharospasm: Secondary | ICD-10-CM

## 2020-05-16 MED ORDER — PAROXETINE HCL 20 MG PO TABS: 20.0000 mg | ORAL_TABLET | Freq: Every day | ORAL | 3 refills | Status: DC

## 2020-05-16 NOTE — Progress Notes (Signed)
Virtual Visit via Video Note  I connected with Patricia Henry on 05/16/20 at 11:30 AM EDT by a video enabled telemedicine application 2/2 [id number] pandemic and verified that I am speaking with the correct person using two identifiers.  Location patient: home Location provider:work or home office Persons participating in the virtual visit: patient, provider  I discussed the limitations of evaluation and management by telemedicine and the availability of in person appointments. The patient expressed understanding and agreed to proceed.   HPI: Pt had to call EMS on Sunday for her husband as he had an elevated temp from pneumonia.  He is back home from the hospital, but is unable to do much for himself.  He is now on home O2.  Pt states she feels broken, crying intermittently, and has this "crushed sensation" that she wants to be fixed.  Pt's sister came in from [place] to help.  Pt to speak with her sister's friend who works with hospice pts in [place] this wk.  Pt speaks with a counselor, Pattricia Boss, PA-C.  Pt also plans to speak with her neighbor who is an oncologist about what to expect.  Grab bars installed in shower have been helpful.   ROS: See pertinent positives and negatives per HPI.  Past Medical History:  Diagnosis Date  . Benign essential blepharospasm   . Hypertension    no meds  . IBS (irritable bowel syndrome)   . Migraines   . UTI (urinary tract infection)     Past Surgical History:  Procedure Laterality Date  . BLEPHAROPLASTY    . EYE SURGERY  2014   SLK/ eyelid surgery/ Bil eyes  . ovary removed     right side/ over 20 years    Family History  Problem Relation Age of Onset  . Alzheimer's disease Mother   . Heart failure Mother   . Arthritis Mother   . Depression Mother   . Diabetes Mother   . Hearing loss Father   . Heart attack Father   . Arthritis Sister   . Hypertension Brother   . Arthritis Maternal Grandmother       Current  Outpatient Medications:  .  Calcium Carb-Cholecalciferol (CALCIUM 600 + D PO), Take by mouth 2 (two) times daily., Disp: , Rfl:  .  Cannabidiol 100 MG/ML SOLN, Take by mouth. Take 6 drops tid, Disp: , Rfl:  .  clonazePAM (KLONOPIN) 0.5 MG tablet, Take 0.5 mg by mouth as needed for anxiety., Disp: , Rfl:  .  clonazePAM (KLONOPIN) 0.5 MG tablet, Take 1 tablet (0.5 mg total) by mouth 2 (two) times daily as needed for anxiety., Disp: 60 tablet, Rfl: 0 .  HYDROcodone-acetaminophen (NORCO/VICODIN) 5-325 MG tablet, Take 1 tablet by mouth every 4 (four) hours as needed., Disp: 12 tablet, Rfl: 0 .  loperamide (IMODIUM) 2 MG capsule, Take 1 mg by mouth. Take half tablet daily, Disp: , Rfl:  .  meclizine (ANTIVERT) 25 MG tablet, Take 1 tablet (25 mg total) by mouth 3 (three) times daily as needed for dizziness., Disp: 30 tablet, Rfl: 0 .  methylphenidate (RITALIN) 10 MG tablet, Take 10 mg by mouth as needed. , Disp: , Rfl:  .  OnabotulinumtoxinA (BOTOX IJ), Inject as directed. Inject into eyelids every 90 days, Disp: , Rfl:  .  ondansetron (ZOFRAN) 4 MG tablet, Take 1 tablet (4 mg total) by mouth every 8 (eight) hours as needed., Disp: 12 tablet, Rfl: 0 .  Phenylephrine HCl (AFRIN ALLERGY NA),  Place into the nose as needed. Diluted with ocean nasal spray , Disp: , Rfl:   EXAM:  VITALS per patient if applicable:  RR between 12-20 bpm  GENERAL: alert, oriented, appears well and in no acute distress  HEENT: atraumatic, conjunctiva clear, no obvious abnormalities on inspection of external nose and ears  NECK: normal movements of the head and neck  LUNGS: on inspection no signs of respiratory distress, breathing rate appears normal, no obvious gross SOB, gasping or wheezing  CV: no obvious cyanosis  MS: moves all visible extremities without noticeable abnormality  PSYCH/NEURO: pleasant and cooperative, no obvious depression or anxiety, speech and thought processing grossly intact  ASSESSMENT AND  PLAN:  Discussed the following assessment and plan:  Adjustment disorder with other symptoms  -pt's husband dx'd with terminal cancer, recently hospitalized 2/2 pna.  Pt under increased stress. -discussed various treatment options -continue therapy and support group -continue Klonopin 0.5 mg prn -discussed starting a daily medication like an SSRI.  Use with caution as on the Beer's list. -will start Paxil qhs as may help with sleep -pt given precautions and possible s/e discussed. -pt to look up book Gone from my sight by Clement Husbands, RN. - Plan: PARoxetine (PAXIL) 20 MG tablet  Blepharospasm of both eyes -worsened by stress -continue Klonopin 0.5 mg -continue f/u with Ophthalmology, Dr. Ramonita Lab.  F/u in 4-6 wks, sooner if needed.   I discussed the assessment and treatment plan with the patient. The patient was provided an opportunity to ask questions and all were answered. The patient agreed with the plan and demonstrated an understanding of the instructions.   The patient was advised to call back or seek an in-person evaluation if the symptoms worsen or if the condition fails to improve as anticipated.  I provided 20 minutes of non-face-to-face time during this encounter.   Billie Ruddy, MD

## 2020-06-02 ENCOUNTER — Encounter: Payer: Self-pay | Admitting: Family Medicine

## 2020-06-02 NOTE — Telephone Encounter (Signed)
Please advise 

## 2020-06-05 ENCOUNTER — Other Ambulatory Visit: Payer: Self-pay

## 2020-06-05 ENCOUNTER — Telehealth: Payer: Self-pay | Admitting: Family Medicine

## 2020-06-05 MED ORDER — SERTRALINE HCL 25 MG PO TABS: 25.0000 mg | ORAL_TABLET | Freq: Every day | ORAL | 1 refills | Status: DC

## 2020-06-05 NOTE — Telephone Encounter (Signed)
Spoke with pt aware to stop taking Paxil and to start taking the new Rx sent to pharmacy for Zoloft 25 mg daily and to follow up in 4-6 weeks, pt verbalized understanding

## 2020-06-05 NOTE — Telephone Encounter (Signed)
pt says she has been sending my chart messages about her medication  Can I please have a status update on your plans for a new medicine?  was her message .she has not heard a response pt would like a call [phone] today

## 2020-06-06 NOTE — Telephone Encounter (Signed)
Spoke with pt advised that Dr Volanda Napoleon prescribed Zoloft and a new Rx was sent to the pharmacy. Pt is aware to f/u in 4-6 weeks

## 2020-06-26 ENCOUNTER — Ambulatory Visit (HOSPITAL_COMMUNITY): Payer: Self-pay

## 2020-06-30 ENCOUNTER — Other Ambulatory Visit: Payer: Self-pay | Admitting: Family Medicine

## 2020-06-30 DIAGNOSIS — F4329 Adjustment disorder with other symptoms: Secondary | ICD-10-CM

## 2020-06-30 NOTE — Telephone Encounter (Signed)
Ok for 90 day supply.  

## 2020-07-01 NOTE — Telephone Encounter (Signed)
Message routed to PCP.

## 2020-07-03 ENCOUNTER — Encounter: Payer: Self-pay | Admitting: Family Medicine

## 2020-07-04 ENCOUNTER — Telehealth (INDEPENDENT_AMBULATORY_CARE_PROVIDER_SITE_OTHER): Payer: Medicare Other | Admitting: Family Medicine

## 2020-07-04 ENCOUNTER — Encounter: Payer: Self-pay | Admitting: Family Medicine

## 2020-07-04 VITALS — Ht 67.0 in | Wt 150.0 lb

## 2020-07-04 DIAGNOSIS — F4329 Adjustment disorder with other symptoms: Secondary | ICD-10-CM | POA: Diagnosis not present

## 2020-07-04 MED ORDER — SERTRALINE HCL 50 MG PO TABS: 50.0000 mg | ORAL_TABLET | Freq: Every day | ORAL | 3 refills | Status: DC

## 2020-07-04 NOTE — Telephone Encounter (Signed)
No. Pt sent a message about possible med changes.  Pt has an appt in the am.

## 2020-07-04 NOTE — Progress Notes (Signed)
Virtual Visit via Video Note  I connected with Patricia Henry on 07/04/20 at  3:30 PM EDT by a video enabled telemedicine application 2/2 [id number] pandemic and verified that I am speaking with the correct person using two identifiers.  Location patient: home Location provider:work or home office Persons participating in the virtual visit: patient, provider  I discussed the limitations of evaluation and management by telemedicine and the availability of in person appointments. The patient expressed understanding and agreed to proceed.   HPI: Pt notes she had 2 breakdowns this wk. endorses increased crying, feeling upset.  Pt states she feels like a "weight is on her"/an overwhelming sensation.  Pt stays busy during the week taking her husband to multiple doctors visits.  Since last visit, pt set up in home help from [facility].  Pt's sister went back to [place].  Pt having accounts changed into her name which has been stressful.  Paxil made patient feel like she was in a fog/devoid of emotions/zombielike.  Pt started Zoloft 25 mg a few weeks ago.     ROS: See pertinent positives and negatives per HPI.  Past Medical History:  Diagnosis Date  . Benign essential blepharospasm   . Hypertension    no meds  . IBS (irritable bowel syndrome)   . Migraines   . UTI (urinary tract infection)     Past Surgical History:  Procedure Laterality Date  . BLEPHAROPLASTY    . EYE SURGERY  2014   SLK/ eyelid surgery/ Bil eyes  . ovary removed     right side/ over 20 years    Family History  Problem Relation Age of Onset  . Alzheimer's disease Mother   . Heart failure Mother   . Arthritis Mother   . Depression Mother   . Diabetes Mother   . Hearing loss Father   . Heart attack Father   . Arthritis Sister   . Hypertension Brother   . Arthritis Maternal Grandmother      Current Outpatient Medications:  .  Calcium Carb-Cholecalciferol (CALCIUM 600 + D PO),  Take by mouth 2 (two) times daily., Disp: , Rfl:  .  Cannabidiol 100 MG/ML SOLN, Take by mouth. Take 6 drops tid, Disp: , Rfl:  .  clonazePAM (KLONOPIN) 0.5 MG tablet, Take 0.5 mg by mouth as needed for anxiety., Disp: , Rfl:  .  clonazePAM (KLONOPIN) 0.5 MG tablet, Take 1 tablet (0.5 mg total) by mouth 2 (two) times daily as needed for anxiety., Disp: 60 tablet, Rfl: 0 .  HYDROcodone-acetaminophen (NORCO/VICODIN) 5-325 MG tablet, Take 1 tablet by mouth every 4 (four) hours as needed., Disp: 12 tablet, Rfl: 0 .  loperamide (IMODIUM) 2 MG capsule, Take 1 mg by mouth. Take half tablet daily, Disp: , Rfl:  .  meclizine (ANTIVERT) 25 MG tablet, Take 1 tablet (25 mg total) by mouth 3 (three) times daily as needed for dizziness., Disp: 30 tablet, Rfl: 0 .  methylphenidate (RITALIN) 10 MG tablet, Take 10 mg by mouth as needed. , Disp: , Rfl:  .  OnabotulinumtoxinA (BOTOX IJ), Inject as directed. Inject into eyelids every 90 days, Disp: , Rfl:  .  ondansetron (ZOFRAN) 4 MG tablet, Take 1 tablet (4 mg total) by mouth every 8 (eight) hours as needed., Disp: 12 tablet, Rfl: 0 .  PARoxetine (PAXIL) 20 MG tablet, Take 1 tablet (20 mg total) by mouth daily., Disp: 30 tablet, Rfl: 3 .  Phenylephrine HCl (AFRIN ALLERGY NA), Place  into the nose as needed. Diluted with ocean nasal spray , Disp: , Rfl:  .  sertraline (ZOLOFT) 25 MG tablet, Take 1 tablet (25 mg total) by mouth daily., Disp: 30 tablet, Rfl: 1  EXAM:  VITALS per patient if applicable:  RR between 12-20 bpm  GENERAL: alert, oriented, appears well though tearful.  In no acute distress  HEENT: atraumatic, conjunctiva clear, no obvious abnormalities on inspection of external nose and ears  NECK: normal movements of the head and neck  LUNGS: on inspection no signs of respiratory distress, breathing rate appears normal, no obvious gross SOB, gasping or wheezing  CV: no obvious cyanosis  MS: moves all visible extremities without noticeable  abnormality  PSYCH/NEURO: pleasant and cooperative, no obvious depression or anxiety, speech and thought processing grossly intact  ASSESSMENT AND PLAN:  Discussed the following assessment and plan:  Adjustment disorder with other symptoms  -will d/c zoloft 25 mg -increase to zoloft 50 mg daily. -Continue follow-up with counselor.  Patient encouraged to consider scheduling more frequent counseling visits while waiting for medicine to take effect. -Given precautions - Plan: sertraline (ZOLOFT) 50 MG tablet  Follow-up in 4-6 weeks, sooner if needed   I discussed the assessment and treatment plan with the patient. The patient was provided an opportunity to ask questions and all were answered. The patient agreed with the plan and demonstrated an understanding of the instructions.   The patient was advised to call back or seek an in-person evaluation if the symptoms worsen or if the condition fails to improve as anticipated.  I provided 13 minutes of non-face-to-face time during this encounter.   Billie Ruddy, MD

## 2020-07-10 ENCOUNTER — Ambulatory Visit: Payer: Medicare Other | Admitting: Family Medicine

## 2020-07-10 DIAGNOSIS — D1801 Hemangioma of skin and subcutaneous tissue: Secondary | ICD-10-CM | POA: Diagnosis not present

## 2020-07-10 DIAGNOSIS — D2262 Melanocytic nevi of left upper limb, including shoulder: Secondary | ICD-10-CM | POA: Diagnosis not present

## 2020-07-10 DIAGNOSIS — L503 Dermatographic urticaria: Secondary | ICD-10-CM | POA: Diagnosis not present

## 2020-07-10 DIAGNOSIS — L565 Disseminated superficial actinic porokeratosis (DSAP): Secondary | ICD-10-CM | POA: Diagnosis not present

## 2020-07-10 DIAGNOSIS — D225 Melanocytic nevi of trunk: Secondary | ICD-10-CM | POA: Diagnosis not present

## 2020-07-10 DIAGNOSIS — L720 Epidermal cyst: Secondary | ICD-10-CM | POA: Diagnosis not present

## 2020-07-10 DIAGNOSIS — D2272 Melanocytic nevi of left lower limb, including hip: Secondary | ICD-10-CM | POA: Diagnosis not present

## 2020-07-10 DIAGNOSIS — D2261 Melanocytic nevi of right upper limb, including shoulder: Secondary | ICD-10-CM | POA: Diagnosis not present

## 2020-07-10 DIAGNOSIS — L821 Other seborrheic keratosis: Secondary | ICD-10-CM | POA: Diagnosis not present

## 2020-07-16 ENCOUNTER — Other Ambulatory Visit: Payer: Self-pay

## 2020-07-16 ENCOUNTER — Encounter (HOSPITAL_COMMUNITY): Payer: Self-pay

## 2020-07-16 ENCOUNTER — Ambulatory Visit (HOSPITAL_COMMUNITY)
Admission: RE | Admit: 2020-07-16 | Discharge: 2020-07-16 | Disposition: A | Discharge status: Home / Self Care | Payer: Medicare Other | Source: Ambulatory Visit | Attending: Urgent Care | Admitting: Urgent Care

## 2020-07-16 VITALS — BP 143/78 | HR 69 | Temp 98.4°F | Resp 19

## 2020-07-16 DIAGNOSIS — H5711 Ocular pain, right eye: Secondary | ICD-10-CM | POA: Diagnosis not present

## 2020-07-16 DIAGNOSIS — H00022 Hordeolum internum right lower eyelid: Secondary | ICD-10-CM

## 2020-07-16 MED ORDER — ERYTHROMYCIN 5 MG/GM OP OINT: TOPICAL_OINTMENT | OPHTHALMIC | 0 refills | Status: DC

## 2020-07-16 NOTE — ED Provider Notes (Signed)
[facility]   MRN: [id number] DOB: May 02, 1951  Subjective:   Patricia Henry is a 69 y.o. female presenting for 5-day history of recurrent painful stye of the right lower eyelid.  Patient has been using warm compresses consistently.  Denies vision change, eye discharge.  Would like to trial rapamycin as she has used this in the past successfully.  She does not have a eye doctor in town that she can follow-up with.  No current facility-administered medications for this encounter.  Current Outpatient Medications:  .  Calcium Carb-Cholecalciferol (CALCIUM 600 + D PO), Take by mouth 2 (two) times daily., Disp: , Rfl:  .  Cannabidiol 100 MG/ML SOLN, Take by mouth. Take 6 drops tid, Disp: , Rfl:  .  clonazePAM (KLONOPIN) 0.5 MG tablet, Take 0.5 mg by mouth as needed for anxiety., Disp: , Rfl:  .  clonazePAM (KLONOPIN) 0.5 MG tablet, Take 1 tablet (0.5 mg total) by mouth 2 (two) times daily as needed for anxiety., Disp: 60 tablet, Rfl: 0 .  erythromycin ophthalmic ointment, Place a 1/2 inch ribbon of ointment into the lower eyelid 4 times daily., Disp: 3.5 g, Rfl: 0 .  HYDROcodone-acetaminophen (NORCO/VICODIN) 5-325 MG tablet, Take 1 tablet by mouth every 4 (four) hours as needed., Disp: 12 tablet, Rfl: 0 .  loperamide (IMODIUM) 2 MG capsule, Take 1 mg by mouth. Take half tablet daily, Disp: , Rfl:  .  meclizine (ANTIVERT) 25 MG tablet, Take 1 tablet (25 mg total) by mouth 3 (three) times daily as needed for dizziness., Disp: 30 tablet, Rfl: 0 .  methylphenidate (RITALIN) 10 MG tablet, Take 10 mg by mouth as needed. , Disp: , Rfl:  .  OnabotulinumtoxinA (BOTOX IJ), Inject as directed. Inject into eyelids every 90 days, Disp: , Rfl:  .  ondansetron (ZOFRAN) 4 MG tablet, Take 1 tablet (4 mg total) by mouth every 8 (eight) hours as needed., Disp: 12 tablet, Rfl: 0 .  Phenylephrine HCl (AFRIN ALLERGY NA), Place into the nose as needed. Diluted with ocean nasal spray , Disp: , Rfl:  .  sertraline  (ZOLOFT) 50 MG tablet, Take 1 tablet (50 mg total) by mouth daily., Disp: 30 tablet, Rfl: 3   No Known Allergies  Past Medical History:  Diagnosis Date  . Benign essential blepharospasm   . Hypertension    no meds  . IBS (irritable bowel syndrome)   . Migraines   . UTI (urinary tract infection)      Past Surgical History:  Procedure Laterality Date  . BLEPHAROPLASTY    . EYE SURGERY  2014   SLK/ eyelid surgery/ Bil eyes  . ovary removed     right side/ over 20 years    Family History  Problem Relation Age of Onset  . Alzheimer's disease Mother   . Heart failure Mother   . Arthritis Mother   . Depression Mother   . Diabetes Mother   . Hearing loss Father   . Heart attack Father   . Arthritis Sister   . Hypertension Brother   . Arthritis Maternal Grandmother     Social History   Tobacco Use  . Smoking status: Former Smoker    Quit date: 05/23/2013    Years since quitting: 7.1  . Smokeless tobacco: Never Used  Vaping Use  . Vaping Use: Never used  Substance Use Topics  . Alcohol use: Yes    Alcohol/week: 7.0 standard drinks    Types: 7 Glasses of wine per week  Comment: week  . Drug use: No    ROS   Objective:   Vitals: BP (!) 143/78   Pulse 69   Temp 98.4 F (36.9 C)   Resp 19   SpO2 94%   Physical Exam Constitutional:      General: She is not in acute distress.    Appearance: Normal appearance. She is well-developed. She is not ill-appearing.  HENT:     Head: Normocephalic and atraumatic.     Nose: Nose normal.     Mouth/Throat:     Mouth: Mucous membranes are moist.     Pharynx: Oropharynx is clear.  Eyes:     General: Lids are everted, no foreign bodies appreciated. No scleral icterus.       Right eye: Hordeolum (internum, medial, lower, ~1/2cm in diameter) present. No foreign body or discharge.        Left eye: No foreign body, discharge or hordeolum.     Extraocular Movements: Extraocular movements intact.     Conjunctiva/sclera:      Right eye: Right conjunctiva is not injected. No chemosis, exudate or hemorrhage.    Pupils: Pupils are equal, round, and reactive to light.  Cardiovascular:     Rate and Rhythm: Normal rate.  Pulmonary:     Effort: Pulmonary effort is normal.  Skin:    General: Skin is warm and dry.  Neurological:     General: No focal deficit present.     Mental Status: She is alert and oriented to person, place, and time.  Psychiatric:        Mood and Affect: Mood normal.        Behavior: Behavior normal.      Assessment and Plan :   PDMP not reviewed this encounter.  1. Acute right eye pain   2. Hordeolum internum of right lower eyelid     Patient has obvious hordeolum internum, recommended continue warm compresses, erythromycin ointment.  Provided patient with information to ophthalmologist in town for follow-up. Counseled patient on potential for adverse effects with medications prescribed/recommended today, ER and return-to-clinic precautions discussed, patient verbalized understanding.    Jaynee Eagles, [place] 07/16/20 [phone]

## 2020-07-16 NOTE — ED Triage Notes (Signed)
Pt presents with complaint of possible stye to her right eye. Reports redness and tenderness since Saturday. Reports using warm compresses at home with no relief. Denies visual impairment.

## 2020-07-26 ENCOUNTER — Other Ambulatory Visit: Payer: Self-pay | Admitting: Family Medicine

## 2020-07-26 DIAGNOSIS — F4329 Adjustment disorder with other symptoms: Secondary | ICD-10-CM

## 2020-07-27 ENCOUNTER — Encounter: Payer: Self-pay | Admitting: Family Medicine

## 2020-08-07 ENCOUNTER — Other Ambulatory Visit: Payer: Self-pay

## 2020-08-07 ENCOUNTER — Encounter: Payer: Self-pay | Admitting: Family Medicine

## 2020-08-07 ENCOUNTER — Telehealth (INDEPENDENT_AMBULATORY_CARE_PROVIDER_SITE_OTHER): Payer: Medicare Other | Admitting: Family Medicine

## 2020-08-07 DIAGNOSIS — F4329 Adjustment disorder with other symptoms: Secondary | ICD-10-CM | POA: Diagnosis not present

## 2020-08-07 MED ORDER — SERTRALINE HCL 100 MG PO TABS: 100.0000 mg | ORAL_TABLET | Freq: Every day | ORAL | 3 refills | Status: DC

## 2020-08-07 NOTE — Progress Notes (Signed)
Virtual Visit via Telephone Note Pt unable to get virtual video visit working, requested phone call.  I connected with Patricia Henry on 08/07/20 at  1:30 PM EDT by telephone and verified that I am speaking with the correct person using two identifiers.   I discussed the limitations, risks, security and privacy concerns of performing an evaluation and management service by telephone and the availability of in person appointments. I also discussed with the patient that there may be a patient responsible charge related to this service. The patient expressed understanding and agreed to proceed.  Location patient: home Location provider: work or home office Participants present for the call: patient, provider Patient did not have a visit in the prior 7 days to address this/these issue(s).   History of Present Illness: Pt is a 69 yo female with pmh sig for blepharospasms, migraines, HTN, IBS who was contacted for f/u.  Pt states she has been feeling a little better.  Though feels like she is having terrible anger and frustration.  Endorses crying in the middle of the night.  Taking Zoloft 50 mg, but inquires about a higher dose.  States family and friends also noticing.  Pt notes hospice is coming by the house today.  Pt and her husband were going to [place], [place] for a "bucket list trip", but they cancelled plans as her husband began having HAs.  Pt states "the cancer is spreading to his brain".   Observations/Objective: Patient sounds anxious, tearful, mildly depressed on the phone. I do not appreciate any SOB. Speech and thought processing are grossly intact. Patient reported vitals:  Assessment and Plan: Adjustment disorder with other symptoms  -worsening -will increase dose of Zoloft from 50 mg to 75 mg x 1 wk as pt expressed concerns about increasing from 50 to 100 mg. -after 1 wk, pt can start Zoloft 100 mg daily -continue counseling -f/u in 2-4 wks, sooner if needed - Plan:  sertraline (ZOLOFT) 100 MG tablet   Follow Up Instructions: F/u in 2-4 wks, sooner if needed.  I did not refer this patient for an OV in the next 24 hours for this/these issue(s).  I discussed the assessment and treatment plan with the patient. The patient was provided an opportunity to ask questions and all were answered. The patient agreed with the plan and demonstrated an understanding of the instructions.   The patient was advised to call back or seek an in-person evaluation if the symptoms worsen or if the condition fails to improve as anticipated.  I provided 11 minutes of non-face-to-face time during this encounter.   Billie Ruddy, MD

## 2020-08-13 DIAGNOSIS — G245 Blepharospasm: Secondary | ICD-10-CM | POA: Diagnosis not present

## 2020-08-20 DIAGNOSIS — J300 Vasomotor rhinitis: Secondary | ICD-10-CM | POA: Diagnosis not present

## 2020-08-20 DIAGNOSIS — J309 Allergic rhinitis, unspecified: Secondary | ICD-10-CM | POA: Diagnosis not present

## 2020-08-26 ENCOUNTER — Other Ambulatory Visit: Payer: Self-pay

## 2020-08-26 ENCOUNTER — Ambulatory Visit (INDEPENDENT_AMBULATORY_CARE_PROVIDER_SITE_OTHER): Payer: Medicare Other

## 2020-08-26 DIAGNOSIS — Z0000 Encounter for general adult medical examination without abnormal findings: Secondary | ICD-10-CM | POA: Diagnosis not present

## 2020-08-26 NOTE — Progress Notes (Signed)
Subjective:   ALEANE WESENBERG is a 69 y.o. female who presents for an Initial Medicare Annual Wellness Visit.  I connected with Torey Regan today by telephone and verified that I am speaking with the correct person using two identifiers. Location patient: home Location provider: work Persons participating in the virtual visit: patient, provider.   I discussed the limitations, risks, security and privacy concerns of performing an evaluation and management service by telephone and the availability of in person appointments. I also discussed with the patient that there may be a patient responsible charge related to this service. The patient expressed understanding and verbally consented to this telephonic visit.    Interactive audio and video telecommunications were attempted between this provider and patient, however failed, due to patient having technical difficulties OR patient did not have access to video capability.  We continued and completed visit with audio only.     Review of Systems    N/A Cardiac Risk Factors include: advanced age (>4men, >28 women)     Objective:    Today's Vitals   There is no height or weight on file to calculate BMI.  Advanced Directives 08/26/2020 11/07/2018 07/06/2018 10/23/2014  Does Patient Have a Medical Advance Directive? Yes No Yes Yes  Type of Paramedic of [place] [employer];Living will - [place];Living will;Out of facility DNR (pink MOST or yellow form) -  Does patient want to make changes to medical advance directive? No - Patient declined - No - Patient declined -  Copy of [place] in Chart? Yes - validated most recent copy scanned in chart (See row information) - No - copy requested -  Would patient like information on creating a medical advance directive? - No - Patient declined - -    Current Medications (verified) Outpatient Encounter Medications as of 08/26/2020  Medication Sig  .  Calcium Carb-Cholecalciferol (CALCIUM 600 + D PO) Take by mouth 2 (two) times daily.  . clonazePAM (KLONOPIN) 0.5 MG tablet Take 1 tablet (0.5 mg total) by mouth 2 (two) times daily as needed for anxiety.  . methylphenidate (RITALIN) 10 MG tablet Take 10 mg by mouth as needed.   . OnabotulinumtoxinA (BOTOX IJ) Inject as directed. Inject into eyelids every 90 days  . Phenylephrine HCl (AFRIN ALLERGY NA) Place into the nose as needed. Diluted with ocean nasal spray   . sertraline (ZOLOFT) 100 MG tablet Take 1 tablet (100 mg total) by mouth daily.  . [DISCONTINUED] clonazePAM (KLONOPIN) 0.5 MG tablet Take 0.5 mg by mouth as needed for anxiety.  . [DISCONTINUED] erythromycin ophthalmic ointment Place a 1/2 inch ribbon of ointment into the lower eyelid 4 times daily.  . [DISCONTINUED] loperamide (IMODIUM) 2 MG capsule Take 1 mg by mouth. Take half tablet daily   No facility-administered encounter medications on file as of 08/26/2020.    Allergies (verified) Patient has no known allergies.   History: Past Medical History:  Diagnosis Date  . Benign essential blepharospasm   . Hypertension    no meds  . IBS (irritable bowel syndrome)   . Migraines   . UTI (urinary tract infection)    Past Surgical History:  Procedure Laterality Date  . BLEPHAROPLASTY    . EYE SURGERY  2014   SLK/ eyelid surgery/ Bil eyes  . ovary removed     right side/ over 20 years   Family History  Problem Relation Age of Onset  . Alzheimer's disease Mother   .  Heart failure Mother   . Arthritis Mother   . Depression Mother   . Diabetes Mother   . Hearing loss Father   . Heart attack Father   . Arthritis Sister   . Hypertension Brother   . Arthritis Maternal Grandmother    Social History   Socioeconomic History  . Marital status: Married    Spouse name: Jenny Reichmann  . Number of children: 0  . Years of education: 74  . Highest education level: Not on file  Occupational History  . Occupation: semi retired     Comment: Oncologist  Tobacco Use  . Smoking status: Former Smoker    Quit date: 05/23/2013    Years since quitting: 7.2  . Smokeless tobacco: Never Used  Vaping Use  . Vaping Use: Never used  Substance and Sexual Activity  . Alcohol use: Yes    Alcohol/week: 7.0 standard drinks    Types: 7 Glasses of wine per week    Comment: week  . Drug use: No  . Sexual activity: Yes    Partners: Male    Birth control/protection: Post-menopausal  Other Topics Concern  . Not on file  Social History Narrative   Patient consumes 2 cups of caffiene ,right handed   Social Determinants of Health   Financial Resource Strain: Low Risk   . Difficulty of Paying Living Expenses: Not hard at all  Food Insecurity: No Food Insecurity  . Worried About Charity fundraiser in the Last Year: Never true  . Ran Out of Food in the Last Year: Never true  Transportation Needs: No Transportation Needs  . Lack of Transportation (Medical): No  . Lack of Transportation (Non-Medical): No  Physical Activity: Insufficiently Active  . Days of Exercise per Week: 2 days  . Minutes of Exercise per Session: 60 min  Stress: No Stress Concern Present  . Feeling of Stress : Not at all  Social Connections: Moderately Isolated  . Frequency of Communication with Friends and Family: More than three times a week  . Frequency of Social Gatherings with Friends and Family: Three times a week  . Attends Religious Services: Never  . Active Member of Clubs or Organizations: No  . Attends Archivist Meetings: Never  . Marital Status: Married    Tobacco Counseling Counseling given: Not Answered   Clinical Intake:  Pre-visit preparation completed: Yes  Pain : No/denies pain     Nutritional Risks: None Diabetes: No  How often do you need to have someone help you when you read instructions, pamphlets, or other written materials from your doctor or pharmacy?: 1 - Never What is the last grade level you completed  in school?: Associates Degree  Diabetic?No  Interpreter Needed?: No  Information entered by :: [place] of Daily Living In your present state of health, do you have any difficulty performing the following activities: 08/26/2020  Hearing? N  Vision? N  Difficulty concentrating or making decisions? N  Walking or climbing stairs? N  Dressing or bathing? N  Doing errands, shopping? N  Preparing Food and eating ? N  Using the Toilet? N  In the past six months, have you accidently leaked urine? N  Do you have problems with loss of bowel control? N  Managing your Medications? N  Managing your Finances? N  Housekeeping or managing your Housekeeping? N  Some recent data might be hidden    Patient Care Team: Billie Ruddy, MD as PCP - General (  Family Medicine) Star Age, MD as Attending Physician (Neurology) Almedia Balls, MD as Consulting Physician (Orthopedic Surgery)  Indicate any recent Medical Services you may have received from other than [facility] providers in the past year (date may be approximate).     Assessment:   This is a routine wellness examination for Roxane.  Hearing/Vision screen  Hearing Screening   125Hz  250Hz  500Hz  1000Hz  2000Hz  3000Hz  4000Hz  6000Hz  8000Hz   Right ear:           Left ear:           Vision Screening Comments: Patient states gets eye checked eye annually   Dietary issues and exercise activities discussed: Current Exercise Habits: Home exercise routine, Type of exercise: walking, Time (Minutes): 60, Frequency (Times/Week): 2, Weekly Exercise (Minutes/Week): 120, Intensity: Mild  Goals    .  Patient Stated (pt-stated)      I will continue to walk for 1 hour at least twice a week       Depression Screen PHQ 2/9 Scores 08/26/2020  PHQ - 2 Score 1  PHQ- 9 Score 1    Fall Risk Fall Risk  08/26/2020  Falls in the past year? 0  Number falls in past yr: 0  Injury with Fall? 0  Risk for fall due to : Medication side effect    Follow up Falls evaluation completed;Falls prevention discussed    Any stairs in or around the home? Yes  If so, are there any without handrails? No  Home free of loose throw rugs in walkways, pet beds, electrical cords, etc? No  Adequate lighting in your home to reduce risk of falls? No   ASSISTIVE DEVICES UTILIZED TO PREVENT FALLS:  Life alert? No  Use of a cane, walker or w/c? Yes  Grab bars in the bathroom? Yes  Shower chair or bench in shower? Yes  Elevated toilet seat or a handicapped toilet? Yes     Cognitive Function:   Cognitive screening not indicated based on direct observation       Immunizations Immunization History  Administered Date(s) Administered  . Fluad Quad(high Dose 65+) 10/25/2019  . Influenza, High Dose Seasonal PF 02/03/2018, 10/12/2018  . PFIZER SARS-COV-2 Vaccination 01/14/2020, 02/04/2020    TDAP status: Up to date Flu Vaccine status: Up to date Pneumococcal vaccine status: Completed during today's visit. Covid-19 vaccine status: Completed vaccines  Qualifies for Shingles Vaccine? Yes   Zostavax completed No   Shingrix Completed?: No.    Education has been provided regarding the importance of this vaccine. Patient has been advised to call insurance company to determine out of pocket expense if they have not yet received this vaccine. Advised may also receive vaccine at local pharmacy or Health Dept. Verbalized acceptance and understanding.  Screening Tests Health Maintenance  Topic Date Due  . Hepatitis C Screening  Never done  . TETANUS/TDAP  Never done  . DEXA SCAN  Never done  . PNA vac Low Risk Adult (1 of 2 - PCV13) Never done  . INFLUENZA VACCINE  07/20/2020  . MAMMOGRAM  03/26/2022  . COVID-19 Vaccine  Completed    Health Maintenance  Health Maintenance Due  Topic Date Due  . Hepatitis C Screening  Never done  . TETANUS/TDAP  Never done  . DEXA SCAN  Never done  . PNA vac Low Risk Adult (1 of 2 - PCV13) Never done  .  INFLUENZA VACCINE  07/20/2020    Colorectal cancer screening: Completed 10/13/2011. Repeat every 10 years  Mammogram status: Completed 03/26/2020. Repeat every year  Bone Density Status: Patient states she had a Bone density screening with her previous provider  Lung Cancer Screening: (Low Dose CT Chest recommended if Age 107-80 years, 30 pack-year currently smoking OR have quit w/in 15years.) does not qualify.   Lung Cancer Screening Referral: N/A  Additional Screening:  Hepatitis C Screening: does qualify;   Vision Screening: Recommended annual ophthalmology exams for early detection of glaucoma and other disorders of the eye. Is the patient up to date with their annual eye exam?  Yes  Who is the provider or what is the name of the office in which the patient attends annual eye exams? [facility]  If pt is not established with a provider, would they like to be referred to a provider to establish care? No .   Dental Screening: Recommended annual dental exams for proper oral hygiene  Community Resource Referral / Chronic Care Management: CRR required this visit?  No   CCM required this visit?  No      Plan:     I have personally reviewed and noted the following in the patient's chart:   . Medical and social history . Use of alcohol, tobacco or illicit drugs  . Current medications and supplements . Functional ability and status . Nutritional status . Physical activity . Advanced directives . List of other physicians . Hospitalizations, surgeries, and ER visits in previous 12 months . Vitals . Screenings to include cognitive, depression, and falls . Referrals and appointments  In addition, I have reviewed and discussed with patient certain preventive protocols, quality metrics, and best practice recommendations. A written personalized care plan for preventive services as well as general preventive health recommendations were provided to patient.     Ofilia Neas,  LPN   08/25/2228   Nurse Notes: None

## 2020-08-26 NOTE — Patient Instructions (Signed)
[name] , Thank you for taking time to come for your Medicare Wellness Visit. I appreciate your ongoing commitment to your health goals. Please review the following plan we discussed and let me know if I can assist you in the future.   Screening recommendations/referrals: Colonoscopy: Up to date, next due 10/12/2021 Mammogram: Up to date, next due 03/26/2021 Bone Density: Awaiting your records from your previous PCP  Recommended yearly ophthalmology/optometry visit for glaucoma screening and checkup Recommended yearly dental visit for hygiene and checkup  Vaccinations: Influenza vaccine: Currently due, you may receive your next flu shot at your next office visit  Pneumococcal vaccine: Awaiting records from your previous PCP  Tdap vaccine: Awaiting records from your previous PCP  Shingles vaccine: Awaiting records from your previous PCP     Advanced directives: Please bring Copies of your medical advanced directives to your next office visit so that we may scan them into your chart   Conditions/risks identified: None   Next appointment: None   Preventive Care 65 Years and Older, Female Preventive care refers to lifestyle choices and visits with your health care provider that can promote health and wellness. What does preventive care include?  A yearly physical exam. This is also called an annual well check.  Dental exams once or twice a year.  Routine eye exams. Ask your health care provider how often you should have your eyes checked.  Personal lifestyle choices, including:  Daily care of your teeth and gums.  Regular physical activity.  Eating a healthy diet.  Avoiding tobacco and drug use.  Limiting alcohol use.  Practicing safe sex.  Taking low-dose aspirin every day.  Taking vitamin and mineral supplements as recommended by your health care provider. What happens during an annual well check? The services and screenings done by your health care provider during  your annual well check will depend on your age, overall health, lifestyle risk factors, and family history of disease. Counseling  Your health care provider may ask you questions about your:  Alcohol use.  Tobacco use.  Drug use.  Emotional well-being.  Home and relationship well-being.  Sexual activity.  Eating habits.  History of falls.  Memory and ability to understand (cognition).  Work and work Statistician.  Reproductive health. Screening  You may have the following tests or measurements:  Height, weight, and BMI.  Blood pressure.  Lipid and cholesterol levels. These may be checked every 5 years, or more frequently if you are over 73 years old.  Skin check.  Lung cancer screening. You may have this screening every year starting at age 48 if you have a 30-pack-year history of smoking and currently smoke or have quit within the past 15 years.  Fecal occult blood test (FOBT) of the stool. You may have this test every year starting at age 78.  Flexible sigmoidoscopy or colonoscopy. You may have a sigmoidoscopy every 5 years or a colonoscopy every 10 years starting at age 67.  Hepatitis C blood test.  Hepatitis B blood test.  Sexually transmitted disease (STD) testing.  Diabetes screening. This is done by checking your blood sugar (glucose) after you have not eaten for a while (fasting). You may have this done every 1-3 years.  Bone density scan. This is done to screen for osteoporosis. You may have this done starting at age 68.  Mammogram. This may be done every 1-2 years. Talk to your health care provider about how often you should have regular mammograms. Talk with your  health care provider about your test results, treatment options, and if necessary, the need for more tests. Vaccines  Your health care provider may recommend certain vaccines, such as:  Influenza vaccine. This is recommended every year.  Tetanus, diphtheria, and acellular pertussis (Tdap,  Td) vaccine. You may need a Td booster every 10 years.  Zoster vaccine. You may need this after age 11.  Pneumococcal 13-valent conjugate (PCV13) vaccine. One dose is recommended after age 33.  Pneumococcal polysaccharide (PPSV23) vaccine. One dose is recommended after age 45. Talk to your health care provider about which screenings and vaccines you need and how often you need them. This information is not intended to replace advice given to you by your health care provider. Make sure you discuss any questions you have with your health care provider. Document Released: 01/02/2016 Document Revised: 08/25/2016 Document Reviewed: 10/07/2015 Elsevier Interactive Patient Education  2017 [place] Prevention in the Home Falls can cause injuries. They can happen to people of all ages. There are many things you can do to make your home safe and to help prevent falls. What can I do on the outside of my home?  Regularly fix the edges of walkways and driveways and fix any cracks.  Remove anything that might make you trip as you walk through a door, such as a raised step or threshold.  Trim any bushes or trees on the path to your home.  Use bright outdoor lighting.  Clear any walking paths of anything that might make someone trip, such as rocks or tools.  Regularly check to see if handrails are loose or broken. Make sure that both sides of any steps have handrails.  Any raised decks and porches should have guardrails on the edges.  Have any leaves, snow, or ice cleared regularly.  Use sand or salt on walking paths during winter.  Clean up any spills in your garage right away. This includes oil or grease spills. What can I do in the bathroom?  Use night lights.  Install grab bars by the toilet and in the tub and shower. Do not use towel bars as grab bars.  Use non-skid mats or decals in the tub or shower.  If you need to sit down in the shower, use a plastic, non-slip  stool.  Keep the floor dry. Clean up any water that spills on the floor as soon as it happens.  Remove soap buildup in the tub or shower regularly.  Attach bath mats securely with double-sided non-slip rug tape.  Do not have throw rugs and other things on the floor that can make you trip. What can I do in the bedroom?  Use night lights.  Make sure that you have a light by your bed that is easy to reach.  Do not use any sheets or blankets that are too big for your bed. They should not hang down onto the floor.  Have a firm chair that has side arms. You can use this for support while you get dressed.  Do not have throw rugs and other things on the floor that can make you trip. What can I do in the kitchen?  Clean up any spills right away.  Avoid walking on wet floors.  Keep items that you use a lot in easy-to-reach places.  If you need to reach something above you, use a strong step stool that has a grab bar.  Keep electrical cords out of the way.  Do not use  floor polish or wax that makes floors slippery. If you must use wax, use non-skid floor wax.  Do not have throw rugs and other things on the floor that can make you trip. What can I do with my stairs?  Do not leave any items on the stairs.  Make sure that there are handrails on both sides of the stairs and use them. Fix handrails that are broken or loose. Make sure that handrails are as long as the stairways.  Check any carpeting to make sure that it is firmly attached to the stairs. Fix any carpet that is loose or worn.  Avoid having throw rugs at the top or bottom of the stairs. If you do have throw rugs, attach them to the floor with carpet tape.  Make sure that you have a light switch at the top of the stairs and the bottom of the stairs. If you do not have them, ask someone to add them for you. What else can I do to help prevent falls?  Wear shoes that:  Do not have high heels.  Have rubber bottoms.  Are  comfortable and fit you well.  Are closed at the toe. Do not wear sandals.  If you use a stepladder:  Make sure that it is fully opened. Do not climb a closed stepladder.  Make sure that both sides of the stepladder are locked into place.  Ask someone to hold it for you, if possible.  Clearly mark and make sure that you can see:  Any grab bars or handrails.  First and last steps.  Where the edge of each step is.  Use tools that help you move around (mobility aids) if they are needed. These include:  Canes.  Walkers.  Scooters.  Crutches.  Turn on the lights when you go into a dark area. Replace any light bulbs as soon as they burn out.  Set up your furniture so you have a clear path. Avoid moving your furniture around.  If any of your floors are uneven, fix them.  If there are any pets around you, be aware of where they are.  Review your medicines with your doctor. Some medicines can make you feel dizzy. This can increase your chance of falling. Ask your doctor what other things that you can do to help prevent falls. This information is not intended to replace advice given to you by your health care provider. Make sure you discuss any questions you have with your health care provider. Document Released: 10/02/2009 Document Revised: 05/13/2016 Document Reviewed: 01/10/2015 Elsevier Interactive Patient Education  2017 [employer].

## 2020-08-30 ENCOUNTER — Other Ambulatory Visit: Payer: Self-pay | Admitting: Family Medicine

## 2020-08-30 DIAGNOSIS — F4329 Adjustment disorder with other symptoms: Secondary | ICD-10-CM

## 2020-09-01 ENCOUNTER — Telehealth: Payer: Self-pay

## 2020-09-01 NOTE — Telephone Encounter (Signed)
  [facility] prescribed Henry with Ritalin at her last visit, advised to notify Dr Volanda Napoleon

## 2020-09-24 DIAGNOSIS — Z23 Encounter for immunization: Secondary | ICD-10-CM | POA: Diagnosis not present

## 2020-09-26 ENCOUNTER — Encounter: Payer: Self-pay | Admitting: Family Medicine

## 2020-09-26 ENCOUNTER — Telehealth (INDEPENDENT_AMBULATORY_CARE_PROVIDER_SITE_OTHER): Payer: Medicare Other | Admitting: Family Medicine

## 2020-09-26 DIAGNOSIS — R5383 Other fatigue: Secondary | ICD-10-CM | POA: Diagnosis not present

## 2020-09-26 DIAGNOSIS — G245 Blepharospasm: Secondary | ICD-10-CM

## 2020-09-26 DIAGNOSIS — F4329 Adjustment disorder with other symptoms: Secondary | ICD-10-CM

## 2020-09-26 NOTE — Progress Notes (Signed)
Virtual Visit via Video Note  I connected with Patricia Henry on 09/26/20 at  3:00 PM EDT by a video enabled telemedicine application 2/2 [id number]-30 pandemic and verified that I am speaking with the correct person using two identifiers.  Location patient: home Location provider:work or home office Persons participating in the virtual visit: patient, provider  I discussed the limitations of evaluation and management by telemedicine and the availability of in person appointments. The patient expressed understanding and agreed to proceed.   HPI: Pt is a 69 yo female with pmh sig for blepharospasms of both eyes, migraines, adjustment disorder who was seen for follow-up.  Pt states she needs advice as she is exhausted.  Pt caring for her husband who was dx'd with terminal cancer.  Pt starting to feel like she is "drowning again with all the responsibilities at home".  Pt feels like she does not have any time to get everything done.  Pt states she is not sleeping well.  Usually take a klonazepam in the early afternoon as that when she feels like the anxiety peaks.    Pt gets in bed around 10 and falls asleep by 11 pm.  Her alarm is set for 5 am for her husband's meds and again at 8 am for meds.  Pt may get up once to urinate.   Pt still in therapy with Hor[facility].  Also has the hospice social worker coming by next wk.  Pt trying to get more help in the house.  Pt has looked into [facility], awaiting on a care plan.  Pt's sister is in town helping and cooking.  ROS: See pertinent positives and negatives per HPI.  Past Medical History:  Diagnosis Date  . Benign essential blepharospasm   . Hypertension    no meds  . IBS (irritable bowel syndrome)   . Migraines   . UTI (urinary tract infection)     Past Surgical History:  Procedure Laterality Date  . BLEPHAROPLASTY    . EYE SURGERY  2014   SLK/ eyelid surgery/ Bil eyes  . ovary removed     right side/ over 20 years    Family History  Problem Relation  Age of Onset  . Alzheimer's disease Mother   . Heart failure Mother   . Arthritis Mother   . Depression Mother   . Diabetes Mother   . Hearing loss Father   . Heart attack Father   . Arthritis Sister   . Hypertension Brother   . Arthritis Maternal Grandmother     Current Outpatient Medications:  .  Calcium Carb-Cholecalciferol (CALCIUM 600 + D PO), Take by mouth 2 (two) times daily., Disp: , Rfl:  .  clonazePAM (KLONOPIN) 0.5 MG tablet, Take 1 tablet (0.5 mg total) by mouth 2 (two) times daily as needed for anxiety., Disp: 60 tablet, Rfl: 0 .  methylphenidate (RITALIN) 10 MG tablet, Take 10 mg by mouth as needed. , Disp: , Rfl:  .  OnabotulinumtoxinA (BOTOX IJ), Inject as directed. Inject into eyelids every 90 days, Disp: , Rfl:  .  Phenylephrine HCl (AFRIN ALLERGY NA), Place into the nose as needed. Diluted with ocean nasal spray , Disp: , Rfl:  .  sertraline (ZOLOFT) 100 MG tablet, TAKE 1 TABLET BY MOUTH EVERY DAY, Disp: 90 tablet, Rfl: 0  EXAM:  VITALS per patient if applicable:  RR between 12-20 bpm  GENERAL: alert, oriented, appears well and in no acute distress  HEENT: atraumatic, conjunctiva clear, no obvious abnormalities on  inspection of external nose and ears  NECK: normal movements of the head and neck  LUNGS: on inspection no signs of respiratory distress, breathing rate appears normal, no obvious gross SOB, gasping or wheezing  CV: no obvious cyanosis  MS: moves all visible extremities without noticeable abnormality  PSYCH/NEURO: pleasant and cooperative, no obvious depression or anxiety, speech and thought processing grossly intact  ASSESSMENT AND PLAN:  Discussed the following assessment and plan:  Fatigue, unspecified type  -Likely 2/2 increased responsibilities and being a caregiver. - Plan: Vitamin D, 25-hydroxy, Vitamin B12, TSH, CBC with Differential/Platelet, BASIC METABOLIC PANEL WITH GFR  Adjustment disorder with other symptoms  -Stable.   Noticeable improvement in patient since previous visits. -We will continue Zoloft 100 mg daily -Discussed having outside help coming into the home and other ways to help take task off of her plate will likely be beneficial -Patient encouraged to continue counseling - Plan: TSH  Blepharospasm of both eyes -continue  -Continue follow-up with ophthalmology, Dr. Ramonita Lab  Plan: BASIC METABOLIC PANEL WITH GFR, Magnesium   I discussed the assessment and treatment plan with the patient. The patient was provided an opportunity to ask questions and all were answered. The patient agreed with the plan and demonstrated an understanding of the instructions.   The patient was advised to call back or seek an in-person evaluation if the symptoms worsen or if the condition fails to improve as anticipated.  Billie Ruddy, MD

## 2020-10-01 ENCOUNTER — Other Ambulatory Visit: Payer: Self-pay

## 2020-10-01 ENCOUNTER — Other Ambulatory Visit: Payer: Medicare Other

## 2020-10-01 DIAGNOSIS — G245 Blepharospasm: Secondary | ICD-10-CM

## 2020-10-01 DIAGNOSIS — F4329 Adjustment disorder with other symptoms: Secondary | ICD-10-CM

## 2020-10-01 DIAGNOSIS — R5383 Other fatigue: Secondary | ICD-10-CM | POA: Diagnosis not present

## 2020-10-02 LAB — CBC WITH DIFFERENTIAL/PLATELET
Absolute Monocytes: 295 cells/uL (ref 200–950)
Basophils Absolute: 29 cells/uL (ref 0–200)
Basophils Relative: 0.7 %
Eosinophils Absolute: 70 cells/uL (ref 15–500)
Eosinophils Relative: 1.7 %
HCT: 42.1 % (ref 35.0–45.0)
Hemoglobin: 13.8 g/dL (ref 11.7–15.5)
Lymphs Abs: 1357 cells/uL (ref 850–3900)
MCH: 30.2 pg (ref 27.0–33.0)
MCHC: 32.8 g/dL (ref 32.0–36.0)
MCV: 92.1 fL (ref 80.0–100.0)
MPV: 10 fL (ref 7.5–12.5)
Monocytes Relative: 7.2 %
Neutro Abs: 2349 cells/uL (ref 1500–7800)
Neutrophils Relative %: 57.3 %
Platelets: 150 10*3/uL (ref 140–400)
RBC: 4.57 10*6/uL (ref 3.80–5.10)
RDW: 12.8 % (ref 11.0–15.0)
Total Lymphocyte: 33.1 %
WBC: 4.1 10*3/uL (ref 3.8–10.8)

## 2020-10-02 LAB — BASIC METABOLIC PANEL WITH GFR
BUN: 19 mg/dL (ref 7–25)
CO2: 29 mmol/L (ref 20–32)
Calcium: 9.1 mg/dL (ref 8.6–10.4)
Chloride: 105 mmol/L (ref 98–110)
Creat: 0.83 mg/dL (ref 0.60–0.93)
GFR, Est African American: 83 mL/min/{1.73_m2} (ref >=60)
GFR, Est Non African American: 71 mL/min/{1.73_m2} (ref >=60)
Glucose, Bld: 124 mg/dL — ABNORMAL HIGH (ref 65–99)
Potassium: 4 mmol/L (ref 3.5–5.3)
Sodium: 141 mmol/L (ref 135–146)

## 2020-10-02 LAB — TSH: TSH: 2.19 mIU/L (ref 0.40–4.50)

## 2020-10-02 LAB — MAGNESIUM: Magnesium: 2.3 mg/dL (ref 1.5–2.5)

## 2020-10-02 LAB — VITAMIN B12: Vitamin B-12: 333 pg/mL (ref 200–1100)

## 2020-10-02 LAB — VITAMIN D 25 HYDROXY (VIT D DEFICIENCY, FRACTURES): Vit D, 25-Hydroxy: 68 ng/mL (ref 30–100)

## 2020-10-07 ENCOUNTER — Ambulatory Visit: Payer: Medicare Other

## 2020-10-08 ENCOUNTER — Telehealth: Payer: Self-pay | Admitting: *Deleted

## 2020-10-08 NOTE — Telephone Encounter (Signed)
Patient called requesting to speak with Izora Gala to schedule her B12. Per patient Dr Volanda Napoleon was to discuss with Izora Gala about it.

## 2020-10-08 NOTE — Telephone Encounter (Signed)
Pt is scheduled for her first B12 tomorrow at 9.30 am

## 2020-10-09 ENCOUNTER — Other Ambulatory Visit: Payer: Self-pay

## 2020-10-09 ENCOUNTER — Ambulatory Visit (INDEPENDENT_AMBULATORY_CARE_PROVIDER_SITE_OTHER): Payer: Medicare Other | Admitting: *Deleted

## 2020-10-09 DIAGNOSIS — R5383 Other fatigue: Secondary | ICD-10-CM

## 2020-10-09 MED ORDER — CYANOCOBALAMIN 1000 MCG/ML IJ SOLN
1000.0000 ug | Freq: Once | INTRAMUSCULAR | Status: AC
  Administered 2020-10-09: 1000 ug via INTRAMUSCULAR

## 2020-10-09 NOTE — Progress Notes (Signed)
Pt came in today to receive a  Vitamin B12 injection, pt given injection rt arm, pt no concerns with injection.

## 2020-10-14 DIAGNOSIS — H2513 Age-related nuclear cataract, bilateral: Secondary | ICD-10-CM | POA: Diagnosis not present

## 2020-10-25 ENCOUNTER — Other Ambulatory Visit: Payer: Self-pay | Admitting: Family Medicine

## 2020-10-25 DIAGNOSIS — F4329 Adjustment disorder with other symptoms: Secondary | ICD-10-CM

## 2020-11-10 ENCOUNTER — Ambulatory Visit (INDEPENDENT_AMBULATORY_CARE_PROVIDER_SITE_OTHER): Payer: Medicare Other | Admitting: *Deleted

## 2020-11-10 ENCOUNTER — Other Ambulatory Visit: Payer: Self-pay

## 2020-11-10 DIAGNOSIS — E538 Deficiency of other specified B group vitamins: Secondary | ICD-10-CM | POA: Diagnosis not present

## 2020-11-10 MED ORDER — CYANOCOBALAMIN 1000 MCG/ML IJ SOLN
1000.0000 ug | Freq: Once | INTRAMUSCULAR | Status: AC
  Administered 2020-11-10: 1000 ug via INTRAMUSCULAR

## 2020-11-10 NOTE — Progress Notes (Signed)
Per orders of Dr. Banks, injection of B12 given by Federick Levene. Patient tolerated injection well.   

## 2020-11-12 DIAGNOSIS — G245 Blepharospasm: Secondary | ICD-10-CM | POA: Diagnosis not present

## 2020-11-12 DIAGNOSIS — F418 Other specified anxiety disorders: Secondary | ICD-10-CM | POA: Diagnosis not present

## 2020-11-23 ENCOUNTER — Encounter (HOSPITAL_COMMUNITY): Payer: Self-pay

## 2020-11-23 ENCOUNTER — Ambulatory Visit (HOSPITAL_COMMUNITY)
Admission: RE | Admit: 2020-11-23 | Discharge: 2020-11-23 | Disposition: A | Discharge status: Home / Self Care | Payer: Medicare Other | Source: Ambulatory Visit

## 2020-11-23 ENCOUNTER — Other Ambulatory Visit: Payer: Self-pay

## 2020-11-23 VITALS — BP 173/89 | HR 72 | Temp 98.3°F | Resp 16

## 2020-11-23 DIAGNOSIS — W5501XA Bitten by cat, initial encounter: Secondary | ICD-10-CM

## 2020-11-23 DIAGNOSIS — L03114 Cellulitis of left upper limb: Secondary | ICD-10-CM

## 2020-11-23 DIAGNOSIS — R030 Elevated blood-pressure reading, without diagnosis of hypertension: Secondary | ICD-10-CM

## 2020-11-23 MED ORDER — AMOXICILLIN-POT CLAVULANATE 875-125 MG PO TABS: 1.0000 | ORAL_TABLET | Freq: Two times a day (BID) | ORAL | 0 refills | Status: DC

## 2020-11-23 MED ORDER — TETANUS-DIPHTH-ACELL PERTUSSIS 5-2.5-18.5 LF-MCG/0.5 IM SUSY
0.5000 mL | PREFILLED_SYRINGE | Freq: Once | INTRAMUSCULAR | Status: AC
  Administered 2020-11-23: 0.5 mL via INTRAMUSCULAR

## 2020-11-23 MED ORDER — TETANUS-DIPHTH-ACELL PERTUSSIS 5-2.5-18.5 LF-MCG/0.5 IM SUSY
PREFILLED_SYRINGE | INTRAMUSCULAR | Status: AC
  Filled 2020-11-23: qty 0.5

## 2020-11-23 NOTE — Discharge Instructions (Signed)
Your tetanus was updated today.    Take the Augmentin as directed.    Keep your wound clean and dry.  Wash it gently twice a day with soap and water.  Apply an antibiotic cream twice a day.    Go to the Emergency Department if you see signs of infection, such as increased pain, redness, pus-like drainage, warmth, fever, chills, or other concerning symptoms.      Your blood pressure is elevated today at 173/89.  Please have this rechecked by your primary care provider in 2-4 weeks.

## 2020-11-23 NOTE — ED Triage Notes (Signed)
Pt reports playing with her cat 2 days ago when the cat "got a little rough" and bit her.  Puncture wound noted to left anterior forearm with surrounding area of erythema; pt states site had purulent drainage this AM.  Also noted a few superficial scabbed scratches to left posterior forearm. Pt states her cat is 2 months behind on rabies vaccine due to circumstances with vet staffing.  Informed pt we must recommend that she goes to the ED for possible rabies vaccine series; pt refuses - states she has no concern for rabies in her cat and will not go to the ED; states wishes to just have wound checked for infection.  LUE CMS intact.

## 2020-11-23 NOTE — ED Provider Notes (Signed)
[facility]    CSN: [id number] Arrival date & time: 11/23/20  1643      History   Chief Complaint Chief Complaint  Patient presents with  . Appointment    1700  . Animal Bite    HPI Patricia Henry is a 69 y.o. female.   Patient presents with a cat bite on her left forearm which occurred 2 days ago.  She states the area is red, swollen, tender.  She states there was some purulent drainage this morning.  She denies fever, chills, numbness, weakness, or other symptoms.  She states the cat is her own cat and is 2 months behind on her rabies vaccine; patient declines transfer to the ED; she states she does not have concerned about her cat having rabies and just wants to be checked for infection.  Last tetanus unknown.  Her medical history includes IBS, migraines, hypertension.   The history is provided by the patient and medical records.    Past Medical History:  Diagnosis Date  . Benign essential blepharospasm   . Hypertension    no meds  . IBS (irritable bowel syndrome)   . Migraines   . UTI (urinary tract infection)     Patient Active Problem List   Diagnosis Date Noted  . Adjustment disorder with other symptoms 07/04/2020    Past Surgical History:  Procedure Laterality Date  . BLEPHAROPLASTY    . EYE SURGERY  2014   SLK/ eyelid surgery/ Bil eyes  . ovary removed     right side/ over 20 years    OB History    Gravida  1   Para      Term      Preterm      AB  1   Living  0     SAB      TAB  1   Ectopic      Multiple      Live Births  0            Home Medications    Prior to Admission medications   Medication Sig Start Date End Date Taking? Authorizing Provider  azelastine (ASTELIN) 0.1 % nasal spray Place into both nostrils 2 (two) times daily. Use in each nostril as directed   Yes [provider]  clonazePAM (KLONOPIN) 0.5 MG tablet Take 1 tablet (0.5 mg total) by mouth 2 (two) times daily as needed for anxiety.  04/15/20  Yes Billie Ruddy, MD  methylphenidate (RITALIN) 10 MG tablet Take 10 mg by mouth as needed.    Yes [provider]  OnabotulinumtoxinA (BOTOX IJ) Inject as directed. Inject into eyelids every 90 days   Yes [provider]  sertraline (ZOLOFT) 100 MG tablet TAKE 1 TABLET BY MOUTH EVERY DAY 09/02/20  Yes Billie Ruddy, MD  amoxicillin-clavulanate (AUGMENTIN) 875-125 MG tablet Take 1 tablet by mouth every 12 (twelve) hours. 11/23/20   Sharion Balloon, NP  Calcium Carb-Cholecalciferol (CALCIUM 600 + D PO) Take by mouth 2 (two) times daily.    [provider]  Phenylephrine HCl (AFRIN ALLERGY NA) Place into the nose as needed. Diluted with ocean nasal spray     [provider]    Family History Family History  Problem Relation Age of Onset  . Alzheimer's disease Mother   . Heart failure Mother   . Arthritis Mother   . Depression Mother   . Diabetes Mother   . Hearing loss Father   .  Heart attack Father   . Arthritis Sister   . Hypertension Brother   . Arthritis Maternal Grandmother     Social History Social History   Tobacco Use  . Smoking status: Former Smoker    Quit date: 05/23/2013    Years since quitting: 7.5  . Smokeless tobacco: Never Used  Vaping Use  . Vaping Use: Never used  Substance Use Topics  . Alcohol use: Yes    Alcohol/week: 7.0 standard drinks    Types: 7 Glasses of wine per week    Comment: week  . Drug use: No     Allergies   Patient has no known allergies.   Review of Systems Review of Systems  Constitutional: Negative for chills and fever.  HENT: Negative for ear pain and sore throat.   Eyes: Negative for pain and visual disturbance.  Respiratory: Negative for cough and shortness of breath.   Cardiovascular: Negative for chest pain and palpitations.  Gastrointestinal: Negative for abdominal pain and vomiting.  Genitourinary: Negative for dysuria and hematuria.  Musculoskeletal: Negative for  arthralgias and back pain.  Skin: Positive for color change and wound.  Neurological: Negative for seizures, syncope, weakness and numbness.  All other systems reviewed and are negative.    Physical Exam Triage Vital Signs ED Triage Vitals  Enc Vitals Group     BP 11/23/20 1703 (!) 173/89     Pulse Rate 11/23/20 1703 72     Resp 11/23/20 1703 16     Temp 11/23/20 1703 98.3 F (36.8 C)     Temp Source 11/23/20 1703 Oral     SpO2 11/23/20 1703 96 %     Weight --      Height --      Head Circumference --      Peak Flow --      Pain Score 11/23/20 1704 0     Pain Loc --      Pain Edu? --      Excl. in [place]? --    No data found.  Updated Vital Signs BP (!) 173/89   Pulse 72   Temp 98.3 F (36.8 C) (Oral)   Resp 16   SpO2 96%   Visual Acuity Right Eye Distance:   Left Eye Distance:   Bilateral Distance:    Right Eye Near:   Left Eye Near:    Bilateral Near:     Physical Exam Vitals and nursing note reviewed.  Constitutional:      General: She is not in acute distress.    Appearance: She is well-developed.  HENT:     Head: Normocephalic and atraumatic.     Mouth/Throat:     Mouth: Mucous membranes are moist.  Eyes:     Conjunctiva/sclera: Conjunctivae normal.  Cardiovascular:     Rate and Rhythm: Normal rate and regular rhythm.     Heart sounds: No murmur heard.   Pulmonary:     Effort: Pulmonary effort is normal. No respiratory distress.     Breath sounds: Normal breath sounds.  Abdominal:     Palpations: Abdomen is soft.     Tenderness: There is no abdominal tenderness.  Musculoskeletal:        General: Tenderness present. Normal range of motion.     Cervical back: Neck supple.  Skin:    General: Skin is warm and dry.     Findings: Erythema and lesion present.     Comments: Puncture wound on left forearm with surrounding erythema.  See picture for details.  Neurological:     General: No focal deficit present.     Mental Status: She is alert and  oriented to person, place, and time.     Sensory: No sensory deficit.     Motor: No weakness.     Gait: Gait normal.  Psychiatric:        Mood and Affect: Mood normal.        Behavior: Behavior normal.        UC Treatments / Results  Labs (all labs ordered are listed, but only abnormal results are displayed) Labs Reviewed - No data to display  EKG   Radiology No results found.  Procedures Procedures (including critical care time)  Medications Ordered in UC Medications  Tdap (BOOSTRIX) injection 0.5 mL (0.5 mLs Intramuscular Given 11/23/20 1724)    Initial Impression / Assessment and Plan / UC Course  I have reviewed the triage vital signs and the nursing notes.  Pertinent labs & imaging results that were available during my care of the patient were reviewed by me and considered in my medical decision making (see chart for details).   Cat bite on left forearm with cellulitis.  Elevated blood pressure reading.  Patient refused transfer to the ED; discussed concern for rabies due to her cat being behind on it's shot.  Tetanus updated today.  Treating with Augmentin.  Wound care instructions and signs of worsening infection discussed.  Instructed patient to go to the ED if she notes signs of worsening infection.  Discussed that her blood pressure is elevated today needs to be rechecked by her PCP in 2 to 4 weeks.  Patient agrees to plan of care.   Final Clinical Impressions(s) / UC Diagnoses   Final diagnoses:  Cat bite, initial encounter  Cellulitis of left forearm  Elevated blood pressure reading     Discharge Instructions     Your tetanus was updated today.    Take the Augmentin as directed.    Keep your wound clean and dry.  Wash it gently twice a day with soap and water.  Apply an antibiotic cream twice a day.    Go to the Emergency Department if you see signs of infection, such as increased pain, redness, pus-like drainage, warmth, fever, chills, or other  concerning symptoms.      Your blood pressure is elevated today at 173/89.  Please have this rechecked by your primary care provider in 2-4 weeks.              ED Prescriptions    Medication Sig Dispense Auth. Provider   amoxicillin-clavulanate (AUGMENTIN) 875-125 MG tablet Take 1 tablet by mouth every 12 (twelve) hours. 14 tablet Sharion Balloon, NP     PDMP not reviewed this encounter.   Sharion Balloon, NP 11/23/20 1737

## 2020-11-30 ENCOUNTER — Other Ambulatory Visit: Payer: Self-pay | Admitting: Family Medicine

## 2020-11-30 DIAGNOSIS — F4329 Adjustment disorder with other symptoms: Secondary | ICD-10-CM

## 2020-12-08 ENCOUNTER — Encounter: Payer: Self-pay | Admitting: Family Medicine

## 2020-12-08 DIAGNOSIS — H16263 Vernal keratoconjunctivitis, with limbar and corneal involvement, bilateral: Secondary | ICD-10-CM | POA: Diagnosis not present

## 2020-12-08 DIAGNOSIS — H04123 Dry eye syndrome of bilateral lacrimal glands: Secondary | ICD-10-CM | POA: Diagnosis not present

## 2020-12-08 DIAGNOSIS — H2511 Age-related nuclear cataract, right eye: Secondary | ICD-10-CM | POA: Diagnosis not present

## 2020-12-08 DIAGNOSIS — G245 Blepharospasm: Secondary | ICD-10-CM | POA: Diagnosis not present

## 2020-12-08 DIAGNOSIS — H2513 Age-related nuclear cataract, bilateral: Secondary | ICD-10-CM | POA: Diagnosis not present

## 2020-12-09 ENCOUNTER — Other Ambulatory Visit: Payer: Self-pay

## 2020-12-09 ENCOUNTER — Ambulatory Visit (INDEPENDENT_AMBULATORY_CARE_PROVIDER_SITE_OTHER): Payer: Medicare Other

## 2020-12-09 DIAGNOSIS — E538 Deficiency of other specified B group vitamins: Secondary | ICD-10-CM | POA: Diagnosis not present

## 2020-12-09 DIAGNOSIS — Z23 Encounter for immunization: Secondary | ICD-10-CM

## 2020-12-09 MED ORDER — CYANOCOBALAMIN 1000 MCG/ML IJ SOLN
1000.0000 ug | Freq: Once | INTRAMUSCULAR | Status: AC
  Administered 2020-12-09: 1000 ug via INTRAMUSCULAR

## 2020-12-09 NOTE — Progress Notes (Signed)
Pt came in for her b12 and her flu vaccine. Flu given in her right deltoid & B12 given in the left. Pt tolerated both injections well.

## 2020-12-10 ENCOUNTER — Other Ambulatory Visit: Payer: Medicare Other

## 2020-12-23 DIAGNOSIS — H2512 Age-related nuclear cataract, left eye: Secondary | ICD-10-CM | POA: Diagnosis not present

## 2020-12-23 DIAGNOSIS — H2511 Age-related nuclear cataract, right eye: Secondary | ICD-10-CM | POA: Diagnosis not present

## 2020-12-26 ENCOUNTER — Ambulatory Visit (INDEPENDENT_AMBULATORY_CARE_PROVIDER_SITE_OTHER): Payer: Medicare Other | Admitting: Family Medicine

## 2020-12-26 ENCOUNTER — Other Ambulatory Visit: Payer: Self-pay

## 2020-12-26 ENCOUNTER — Encounter: Payer: Self-pay | Admitting: Family Medicine

## 2020-12-26 VITALS — BP 170/90 | HR 62 | Temp 98.1°F | Wt 157.6 lb

## 2020-12-26 DIAGNOSIS — R42 Dizziness and giddiness: Secondary | ICD-10-CM | POA: Diagnosis not present

## 2020-12-26 DIAGNOSIS — F4321 Adjustment disorder with depressed mood: Secondary | ICD-10-CM | POA: Diagnosis not present

## 2020-12-26 DIAGNOSIS — G245 Blepharospasm: Secondary | ICD-10-CM | POA: Diagnosis not present

## 2020-12-26 DIAGNOSIS — R030 Elevated blood-pressure reading, without diagnosis of hypertension: Secondary | ICD-10-CM

## 2020-12-26 DIAGNOSIS — I10 Essential (primary) hypertension: Secondary | ICD-10-CM | POA: Diagnosis not present

## 2020-12-26 DIAGNOSIS — G4489 Other headache syndrome: Secondary | ICD-10-CM | POA: Diagnosis not present

## 2020-12-26 MED ORDER — LISINOPRIL 5 MG PO TABS: 5.0000 mg | ORAL_TABLET | Freq: Every day | ORAL | 3 refills | Status: DC

## 2020-12-26 NOTE — Progress Notes (Addendum)
Subjective:    Patient ID: Patricia Henry, female    DOB: 12/30/1950, 70 y.o.   MRN: [id number]  No chief complaint on file.   HPI Patient is a 70 yo female with pmh sig for benign essential blepharospasm, IBS, HTN, migraines who was seen for acute concern.  Pt endorses HTN.  Pt called EMS after feeling dizzy,  BP was 190s/64.  When checked at home earlier this wk bp 165/88.  Pt notes increased stress as her husband recently died from a terminal illness.  Pt stopped taking ritalin and clonazepam as she was afraid they were contributing to her symptoms.  Pt has been on clonazepam x yrs for blepharospasms which are worse with stress.  Pt had cataract removed from R eye earlier this wk.  Was advised not to exercise and that her bp needed to be under control.  Pt has cataract removal for the L eye scheduled next wk.  Past Medical History:  Diagnosis Date  . Benign essential blepharospasm   . Hypertension    no meds  . IBS (irritable bowel syndrome)   . Migraines   . UTI (urinary tract infection)     No Known Allergies  ROS General: Denies fever, chills, night sweats, changes in weight, changes in appetite +dizziness HEENT: Denies headaches, ear pain, changes in vision, rhinorrhea, sore throat +blepharospasm CV: Denies CP, palpitations, SOB, orthopnea Pulm: Denies SOB, cough, wheezing GI: Denies abdominal pain, nausea, vomiting, diarrhea, constipation GU: Denies dysuria, hematuria, frequency, vaginal discharge Msk: Denies muscle cramps, joint pains Neuro: Denies weakness, numbness, tingling Skin: Denies rashes, bruising Psych: Denies depression, anxiety, hallucinations +grief     Objective:    Blood pressure (!) 170/90, pulse 62, temperature 98.1 F (36.7 C), temperature source Oral, weight 157 lb 9.6 oz (71.5 kg), SpO2 98 %. Repeat bp 170/80  Gen. Pleasant, well-nourished, in no distress, normal affect   HEENT: [place]/AT, face symmetric, conjunctiva clear, no scleral icterus, PERRLA,  EOMI, nares patent without drainage Lungs: no accessory muscle use, CTAB, no wheezes or rales Cardiovascular: RRR, no m/r/g, no peripheral edema Musculoskeletal: No deformities, no cyanosis or clubbing, normal tone Neuro:  A&Ox3, CN II-XII intact, normal gait Skin:  Warm, no lesions/ rash   Wt Readings from Last 3 Encounters:  12/26/20 157 lb 9.6 oz (71.5 kg)  07/04/20 150 lb (68 kg)  11/13/18 149 lb (67.6 kg)    Lab Results  Component Value Date   WBC 4.1 10/01/2020   HGB 13.8 10/01/2020   HCT 42.1 10/01/2020   PLT 150 10/01/2020   GLUCOSE 124 (H) 10/01/2020   NA 141 10/01/2020   K 4.0 10/01/2020   CL 105 10/01/2020   CREATININE 0.83 10/01/2020   BUN 19 10/01/2020   CO2 29 10/01/2020   TSH 2.19 10/01/2020   HGBA1C 5.3 10/23/2014    Assessment/Plan:  Essential hypertension  - h/o HTN, not on meds -elevated on several occasions likely d/t increased stress from loss of husband and abrupt cessation of chronic meds including clonazepam and ritalin. -will start lisinopril 5 mg daily. -advised to restart clonazepam -pt to hold ritalin for blepharospasms. -continue lifestyle modifications and checking bp regularly at home.  Bring log and meter to next OFV. - Plan: lisinopril (ZESTRIL) 5 MG tablet  Grief -PHQ 9 score 14 this visit -GAD 7 score 15 this visit -info on counseling services provided. Resources also available through hospice agency. -continue self care. -will continue to monitor  Blepharospasm of both eyes -will hold  ritalin -continue clonazepam 0.5 mg -continue f/u with Ophthalmology  F/u in 2-4 wks, sooner if needed.  Grier Mitts, MD

## 2020-12-30 DIAGNOSIS — H2512 Age-related nuclear cataract, left eye: Secondary | ICD-10-CM | POA: Diagnosis not present

## 2021-01-09 ENCOUNTER — Ambulatory Visit: Payer: Medicare Other

## 2021-01-11 ENCOUNTER — Encounter: Payer: Self-pay | Admitting: Family Medicine

## 2021-01-11 DIAGNOSIS — G245 Blepharospasm: Secondary | ICD-10-CM | POA: Insufficient documentation

## 2021-01-11 DIAGNOSIS — I10 Essential (primary) hypertension: Secondary | ICD-10-CM | POA: Insufficient documentation

## 2021-01-13 DIAGNOSIS — H16142 Punctate keratitis, left eye: Secondary | ICD-10-CM | POA: Diagnosis not present

## 2021-01-15 ENCOUNTER — Ambulatory Visit (INDEPENDENT_AMBULATORY_CARE_PROVIDER_SITE_OTHER): Payer: Medicare Other

## 2021-01-15 ENCOUNTER — Other Ambulatory Visit: Payer: Self-pay

## 2021-01-15 DIAGNOSIS — E538 Deficiency of other specified B group vitamins: Secondary | ICD-10-CM | POA: Diagnosis not present

## 2021-01-15 MED ORDER — CYANOCOBALAMIN 1000 MCG/ML IJ SOLN
1000.0000 ug | Freq: Once | INTRAMUSCULAR | Status: AC
  Administered 2021-01-15: 1000 ug via INTRAMUSCULAR

## 2021-01-15 NOTE — Progress Notes (Signed)
Per orders of Dr. Banks, injection of Cyanocobalamin 1000 mcg/mL given by Zelma Snead N Lourie Retz. Patient tolerated injection well.  

## 2021-01-27 DIAGNOSIS — F418 Other specified anxiety disorders: Secondary | ICD-10-CM | POA: Diagnosis not present

## 2021-01-27 DIAGNOSIS — G245 Blepharospasm: Secondary | ICD-10-CM | POA: Diagnosis not present

## 2021-01-27 DIAGNOSIS — F410 Panic disorder [episodic paroxysmal anxiety] without agoraphobia: Secondary | ICD-10-CM | POA: Diagnosis not present

## 2021-01-30 ENCOUNTER — Encounter: Payer: Self-pay | Admitting: Family Medicine

## 2021-01-30 ENCOUNTER — Telehealth (INDEPENDENT_AMBULATORY_CARE_PROVIDER_SITE_OTHER): Payer: Medicare Other | Admitting: Family Medicine

## 2021-01-30 DIAGNOSIS — I10 Essential (primary) hypertension: Secondary | ICD-10-CM | POA: Diagnosis not present

## 2021-01-30 DIAGNOSIS — G245 Blepharospasm: Secondary | ICD-10-CM | POA: Diagnosis not present

## 2021-01-30 DIAGNOSIS — F4321 Adjustment disorder with depressed mood: Secondary | ICD-10-CM

## 2021-01-30 NOTE — Progress Notes (Signed)
Virtual Visit via Video Note Initially connected via video, however pt had difficulty hearing this provider.  Pt called to continue visit.  I connected with Lady Gary on 01/30/21 at  1:00 PM EST by a video enabled telemedicine application 2/2 [id number] pandemic and verified that I am speaking with the correct person using two identifiers.  Location patient: home Location provider:work or home office Persons participating in the virtual visit: patient, provider  I discussed the limitations of evaluation and management by telemedicine and the availability of in person appointments. The patient expressed understanding and agreed to proceed.   HPI: Pt is a 70 year old female with past medical history significant for benign essential blepharospasm, IBS, migraine who was seen for follow-up on BP.  States her bp has been all over the place, BP ranging 119-160s/76-87.  Denies HAs, changes in vision, dizziness, CP.  States emotionally is all over the place grieving her husband, Jenny Reichmann.  Has crying spells and feels overwhelmed at times. Taking zoloft 100 mg.  Doing yoga, mediating, walking, and using her peloton.  Trying to sleep more.  Speaking with the therapist from hospice biweekly.  Pt planned a trip.  Feels support from her family and friends.  Considering painting again.  Pt had follow-up appointment with Dr. Toy Cookey, ophthalmology this week.  States blepharospasm stable but concern for continued Ativan use.  Ativan was a historic med rx'd by previous ophthalmologist.  Patient restarted Ritalin.  Taking Ativan daily for blepharospasm and recent increased anxiety.  ROS: See pertinent positives and negatives per HPI.  Past Medical History:  Diagnosis Date  . Benign essential blepharospasm   . Hypertension    no meds  . IBS (irritable bowel syndrome)   . Migraines   . UTI (urinary tract infection)     Past Surgical History:  Procedure Laterality Date  . BLEPHAROPLASTY    . EYE SURGERY  2014    SLK/ eyelid surgery/ Bil eyes  . ovary removed     right side/ over 20 years    Family History  Problem Relation Age of Onset  . Alzheimer's disease Mother   . Heart failure Mother   . Arthritis Mother   . Depression Mother   . Diabetes Mother   . Hearing loss Father   . Heart attack Father   . Arthritis Sister   . Hypertension Brother   . Arthritis Maternal Grandmother      Current Outpatient Medications:  .  azelastine (ASTELIN) 0.1 % nasal spray, Place into both nostrils 2 (two) times daily. Use in each nostril as directed, Disp: , Rfl:  .  Calcium Carb-Cholecalciferol (CALCIUM 600 + D PO), Take by mouth 2 (two) times daily., Disp: , Rfl:  .  clonazePAM (KLONOPIN) 0.5 MG tablet, Take 1 tablet (0.5 mg total) by mouth 2 (two) times daily as needed for anxiety., Disp: 60 tablet, Rfl: 0 .  lisinopril (ZESTRIL) 5 MG tablet, Take 1 tablet (5 mg total) by mouth daily., Disp: 30 tablet, Rfl: 3 .  methylphenidate (RITALIN) 10 MG tablet, Take 10 mg by mouth as needed.  (Patient not taking: Reported on 12/26/2020), Disp: , Rfl:  .  OnabotulinumtoxinA (BOTOX IJ), Inject as directed. Inject into eyelids every 90 days, Disp: , Rfl:  .  Phenylephrine HCl (AFRIN ALLERGY NA), Place into the nose as needed. Diluted with ocean nasal spray, Disp: , Rfl:  .  sertraline (ZOLOFT) 100 MG tablet, TAKE 1 TABLET BY MOUTH EVERY DAY, Disp: 90 tablet, Rfl: 0  EXAM:  VITALS per patient if applicable:  RR between 12-20 bpm  GENERAL: alert, oriented, appears well and in no acute distress  HEENT: atraumatic, conjunctiva clear, no obvious abnormalities on inspection of external nose and ears  NECK: normal movements of the head and neck  LUNGS: on inspection no signs of respiratory distress, breathing rate appears normal, no obvious gross SOB, gasping or wheezing  CV: no obvious cyanosis  MS: moves all visible extremities without noticeable abnormality  PSYCH/NEURO: pleasant and cooperative, no obvious  depression or anxiety, speech and thought processing grossly intact  ASSESSMENT AND PLAN:  Discussed the following assessment and plan:  Essential hypertension -liable -Continue to monitor regularly -We will increase Lexapro from 5 mg to 10 mg daily -Continue lifestyle modifications and self-care -Given precautions  Grief -Continue Zoloft 100 mg daily -Continue working with therapy -Continue self-care -Continue to monitor.  Blepharospasm of both eyes -Continue follow-up with ophthalmology, Dr. Ramonita Lab, [place] Ritalin and clonazepam as needed  Follow-up in 2-4 weeks, sooner if needed   I discussed the assessment and treatment plan with the patient. The patient was provided an opportunity to ask questions and all were answered. The patient agreed with the plan and demonstrated an understanding of the instructions.   The patient was advised to call back or seek an in-person evaluation if the symptoms worsen or if the condition fails to improve as anticipated.  I provided 20 minutes of non-face-to-face time during this encounter.   Billie Ruddy, MD

## 2021-01-30 NOTE — Patient Instructions (Signed)
Calcium Content in Foods Calcium is the most abundant mineral in the body. Most of the body's calcium supply is stored in bones and teeth. Calcium helps many parts of the body function normally, including:  Blood and blood vessels.  Nerves.  Hormones.  Muscles.  Bones and teeth. When your calcium stores are low, you may be at risk for low bone mass, bone loss, and broken bones (fractures). When you get enough calcium, it helps to support strong bones and teeth throughout your life. Calcium is especially important for:  Children during growth spurts.  Girls during adolescence.  Women who are pregnant or breastfeeding.  Women after their menstrual cycle stops (postmenopause).  Women whose menstrual cycle has stopped due to anorexia nervosa or regular intense exercise.  People who cannot eat or digest dairy products.  Vegans. Recommended daily amounts of calcium:  Women (ages 71 to 64): 1,000 mg per day.  Women (ages 74 and older): 1,200 mg per day.  Men (ages 71 to 61): 1,000 mg per day.  Men (ages 11 and older): 1,200 mg per day.  Women (ages 68 to 67): 1,300 mg per day.  Men (ages 64 to 52): 1,300 mg per day. General information  Eat foods that are high in calcium. Try to get most of your calcium from food.  Some people may benefit from taking calcium supplements. Check with your health care provider or diet and nutrition specialist (dietitian) before starting any calcium supplements. Calcium supplements may interact with certain medicines. Too much calcium may cause other health problems, such as constipation and kidney stones.  For the body to absorb calcium, it needs vitamin D. Sources of vitamin D include: ? Skin exposure to direct sunlight. ? Foods, such as egg yolks, liver, mushrooms, saltwater fish, and fortified milk. ? Vitamin D supplements. Check with your health care provider or dietitian before starting any vitamin D supplements. What foods are high in  calcium? Foods that are high in calcium contain more than 100 milligrams per serving. Fruits  Fortified orange juice or other fruit juice, 300 mg per 8 oz serving. Vegetables  Collard greens, 360 mg per 8 oz serving.  Kale, 100 mg per 8 oz serving.  Bok choy, 160 mg per 8 oz serving. Grains  Fortified ready-to-eat cereals, 100 to 1,000 mg per 8 oz serving.  Fortified frozen waffles, 200 mg in 2 waffles.  Oatmeal, 140 mg in 1 cup. Meats and other proteins  Sardines, canned with bones, 325 mg per 3 oz serving.  Salmon, canned with bones, 180 mg per 3 oz serving.  Canned shrimp, 125 mg per 3 oz serving.  Baked beans, 160 mg per 4 oz serving.  Tofu, firm, made with calcium sulfate, 253 mg per 4 oz serving. Dairy  Yogurt, plain, low-fat, 310 mg per 6 oz serving.  Nonfat milk, 300 mg per 8 oz serving.  American cheese, 195 mg per 1 oz serving.  Cheddar cheese, 205 mg per 1 oz serving.  Cottage cheese 2%, 105 mg per 4 oz serving.  Fortified soy, rice, or almond milk, 300 mg per 8 oz serving.  Mozzarella, part skim, 210 mg per 1 oz serving. The items listed above may not be a complete list of foods high in calcium. Actual amounts of calcium may be different depending on processing. Contact a dietitian for more information.   What foods are lower in calcium? Foods that are lower in calcium contain 50 mg or less per serving. Fruits  Apple,  about 6 mg.  Banana, about 12 mg. Vegetables  Lettuce, 19 mg per 2 oz serving.  Tomato, about 11 mg. Grains  Rice, 4 mg per 6 oz serving.  Boiled potatoes, 14 mg per 8 oz serving.  White bread, 6 mg per slice. Meats and other proteins  Egg, 27 mg per 2 oz serving.  Red meat, 7 mg per 4 oz serving.  Chicken, 17 mg per 4 oz serving.  Fish, cod, or trout, 20 mg per 4 oz serving. Dairy  Cream cheese, regular, 14 mg per 1 Tbsp serving.  Brie cheese, 50 mg per 1 oz serving.  Parmesan cheese, 70 mg per 1 Tbsp  serving. The items listed above may not be a complete list of foods lower in calcium. Actual amounts of calcium may be different depending on processing. Contact a dietitian for more information. Summary  Calcium is an important mineral in the body because it affects many functions. Getting enough calcium helps support strong bones and teeth throughout your life.  Try to get most of your calcium from food.  Calcium supplements may interact with certain medicines. Check with your health care provider or dietitian before starting any calcium supplements. This information is not intended to replace advice given to you by your health care provider. Make sure you discuss any questions you have with your health care provider. Document Revised: 04/02/2020 Document Reviewed: 04/02/2020 Elsevier Patient Education  [place] protect organs, store calcium, anchor muscles, and support the whole body. Keeping your bones strong is important, especially as you get older. You can take actions to help keep your bones strong and healthy. Why is keeping my bones healthy important? Keeping your bones healthy is important because your body constantly replaces bone cells. Cells get old, and new cells take their place. As we age, we lose bone cells because the body may not be able to make enough new cells to replace the old cells. The amount of bone cells and bone tissue you have is referred to as bone mass. The higher your bone mass, the stronger your bones. The aging process leads to an overall loss of bone mass in the body, which can increase the likelihood of:  Joint pain and stiffness.  Broken bones.  A condition in which the bones become weak and brittle (osteoporosis). A large decline in bone mass occurs in older adults. In women, it occurs about the time of menopause.   What actions can I take to keep my bones healthy? Good health habits are important for maintaining healthy  bones. This includes eating nutritious foods and exercising regularly. To have healthy bones, you need to get enough of the right minerals and vitamins. Most nutrition experts recommend getting these nutrients from the foods that you eat. In some cases, taking supplements may also be recommended. Doing certain types of exercise is also important for bone health. What are the nutritional recommendations for healthy bones? Eating a well-balanced diet with plenty of calcium and vitamin D will help to protect your bones. Nutritional recommendations vary from person to person. Ask your health care provider what is healthy for you. Here are some general guidelines. Get enough calcium Calcium is the most important (essential) mineral for bone health. Most people can get enough calcium from their diet, but supplements may be recommended for people who are at risk for osteoporosis. Good sources of calcium include:  Dairy products, such as low-fat or nonfat milk, cheese, and yogurt.  Dark green leafy vegetables, such as bok choy and broccoli.  Calcium-fortified foods, such as orange juice, cereal, bread, soy beverages, and tofu products.  Nuts, such as almonds. Follow these recommended amounts for daily calcium intake:  Children, age 71-3: 700 mg.  Children, age 51-8: 1,000 mg.  Children, age 31-13: 1,300 mg.  Teens, age 52-18: 1,300 mg.  Adults, age 516-50: 1,000 mg.  Adults, age 31-70: ? Men: 1,000 mg. ? Women: 1,200 mg.  Adults, age 75 or older: 1,200 mg.  Pregnant and breastfeeding females: ? Teens: 1,300 mg. ? Adults: 1,000 mg. Get enough vitamin D Vitamin D is the most essential vitamin for bone health. It helps the body absorb calcium. Sunlight stimulates the skin to make vitamin D, so be sure to get enough sunlight. If you live in a cold climate or you do not get outside often, your health care provider may recommend that you take vitamin D supplements. Good sources of vitamin D in your  diet include:  Egg yolks.  Saltwater fish.  Milk and cereal fortified with vitamin D. Follow these recommended amounts for daily vitamin D intake:  Children and teens, age 71-18: 600 international units.  Adults, age 64 or younger: 400-800 international units.  Adults, age 82 or older: 800-1,000 international units. Get other important nutrients Other nutrients that are important for bone health include:  Phosphorus. This mineral is found in meat, poultry, dairy foods, nuts, and legumes. The recommended daily intake for adult men and adult women is 700 mg.  Magnesium. This mineral is found in seeds, nuts, dark green vegetables, and legumes. The recommended daily intake for adult men is 400-420 mg. For adult women, it is 310-320 mg.  Vitamin K. This vitamin is found in green leafy vegetables. The recommended daily intake is 120 mg for adult men and 90 mg for adult women.   What type of physical activity is best for building and maintaining healthy bones? Weight-bearing and strength-building activities are important for building and maintaining healthy bones. Weight-bearing activities cause muscles and bones to work against gravity. Strength-building activities increase the strength of the muscles that support bones. Weight-bearing and muscle-building activities include:  Walking and hiking.  Jogging and running.  Dancing.  Gym exercises.  Lifting weights.  Tennis and racquetball.  Climbing stairs.  Aerobics. Adults should get at least 30 minutes of moderate physical activity on most days. Children should get at least 60 minutes of moderate physical activity on most days. Ask your health care provider what type of exercise is best for you.   How can I find out if my bone mass is low? Bone mass can be measured with an X-ray test called a bone mineral density (BMD) test. This test is recommended for all women who are age 715 or older. It may also be recommended for:  Men who are  age 81 or older.  People who are at risk for osteoporosis because of: ? Having bones that break easily. ? Having a long-term disease that weakens bones, such as kidney disease or rheumatoid arthritis. ? Having menopause earlier than normal. ? Taking medicine that weakens bones, such as steroids, thyroid hormones, or hormone treatment for breast cancer or prostate cancer. ? Smoking. ? Drinking three or more alcoholic drinks a day. If you find that you have a low bone mass, you may be able to prevent osteoporosis or further bone loss by changing your diet and lifestyle. Where can I find more information? For more information, check  out the following websites:  [place]: AviationTales.fr  [employer] of Health: www.bones.SouthExposed.es  International Osteoporosis Foundation: Administrator.iofbonehealth.org Summary  The aging process leads to an overall loss of bone mass in the body, which can increase the likelihood of broken bones and osteoporosis.  Eating a well-balanced diet with plenty of calcium and vitamin D will help to protect your bones.  Weight-bearing and strength-building activities are also important for building and maintaining strong bones.  Bone mass can be measured with an X-ray test called a bone mineral density (BMD) test. This information is not intended to replace advice given to you by your health care provider. Make sure you discuss any questions you have with your health care provider. Document Revised: 01/02/2018 Document Reviewed: 01/02/2018 Elsevier Patient Education  2021 [place]. Calcium; Vitamin D oral tablets What is this medicine? CALCIUM; VITAMIN D (KAL see um; VYE ta min D) is a vitamin supplement. It is used to prevent conditions of low calcium and vitamin D. This medicine may be used for other purposes; ask your health care provider or pharmacist if you have questions. COMMON BRAND NAME(S): Calcarb 600 with Vitamin D, Calcet  Plus Vitamin D, Calcitrate + D, Calcium Citrate + D3 Maximum, Calcium Citrate Maximum with D, Caltrate, Caltrate 600+D, Citracal + D, Citracal MAXIMUM + D, Citracal Petites with Vitamin D, Citrus Calcium Plus D, OSCAL 500 + D, OSCAL Calcium + D3, OSCAL Extra D3, Osteo-Poretical, Oysco 500 + D, Oysco D, Oystercal-D Calcium What should I tell my health care provider before I take this medicine? They need to know if you have any of these conditions:  constipation  dehydration  heart disease  high level of calcium or vitamin D in the blood  high level of phosphate in the blood  kidney disease  kidney stones  liver disease  parathyroid disease  sarcoidosis  stomach ulcer or obstruction  an unusual or allergic reaction to calcium, vitamin D, tartrazine dye, other medicines, foods, dyes, or preservatives  pregnant or trying to get pregnant  breast-feeding How should I use this medicine? Take this medicine by mouth with a glass of water. Follow the directions on the label. Take with food or within 1 hour after a meal. Take your medicine at regular intervals. Do not take your medicine more often than directed. Talk to your pediatrician regarding the use of this medicine in children. While this medicine may be used in children for selected conditions, precautions do apply. Overdosage: If you think you have taken too much of this medicine contact a poison control center or emergency room at once. NOTE: This medicine is only for you. Do not share this medicine with others. What if I miss a dose? If you miss a dose, take it as soon as you can. If it is almost time for your next dose, take only that dose. Do not take double or extra doses. What may interact with this medicine? Do not take this medicine with any of the following medications:  ammonium chloride  methenamine This medicine may also interact with the following medications:  antibiotics like ciprofloxacin, gatifloxacin,  tetracycline  captopril  delavirdine  diuretics  gabapentin  iron supplements  medicines for fungal infections like ketoconazole and itraconazole  medicines for seizures like ethotoin and phenytoin  mineral oil  mycophenolate  other vitamins with calcium, vitamin D, or minerals  quinidine  rosuvastatin  sucralfate  thyroid medicine This list may not describe all possible interactions. Give your health care  provider a list of all the medicines, herbs, non-prescription drugs, or dietary supplements you use. Also tell them if you smoke, drink alcohol, or use illegal drugs. Some items may interact with your medicine. What should I watch for while using this medicine? Taking this medicine is not a substitute for a well-balanced diet and exercise. Talk with your doctor or health care provider and follow a healthy lifestyle. Do not take this medicine with high-fiber foods, large amounts of alcohol, or drinks containing caffeine. Do not take this medicine within 2 hours of any other medicines. What side effects may I notice from receiving this medicine? Side effects that you should report to your doctor or health care professional as soon as possible:  allergic reactions like skin rash, itching or hives, swelling of the face, lips, or tongue  confusion  dry mouth  high blood pressure  increased hunger or thirst  increased urination  irregular heartbeat  metallic taste  muscle or bone pain  pain when urinating  seizure  unusually weak or tired  weight loss Side effects that usually do not require medical attention (report to your doctor or health care professional if they continue or are bothersome):  constipation  diarrhea  headache  loss of appetite  nausea, vomiting  stomach upset This list may not describe all possible side effects. Call your doctor for medical advice about side effects. You may report side effects to FDA at 1-800-FDA-1088. Where  should I keep my medicine? Keep out of the reach of children. Store at room temperature between 15 and 30 degrees C (59 and 86 degrees F). Protect from light. Keep container tightly closed. Throw away any unused medicine after the expiration date. NOTE: This sheet is a summary. It may not cover all possible information. If you have questions about this medicine, talk to your doctor, pharmacist, or health care provider.  2021 Elsevier/Gold Standard (2008-03-20 17:56:23)

## 2021-02-06 DIAGNOSIS — H04123 Dry eye syndrome of bilateral lacrimal glands: Secondary | ICD-10-CM | POA: Diagnosis not present

## 2021-02-08 ENCOUNTER — Encounter: Payer: Self-pay | Admitting: Family Medicine

## 2021-02-18 ENCOUNTER — Other Ambulatory Visit: Payer: Self-pay | Admitting: Family Medicine

## 2021-02-18 DIAGNOSIS — F4329 Adjustment disorder with other symptoms: Secondary | ICD-10-CM

## 2021-02-20 ENCOUNTER — Ambulatory Visit (INDEPENDENT_AMBULATORY_CARE_PROVIDER_SITE_OTHER): Payer: Medicare Other | Admitting: Family Medicine

## 2021-02-20 ENCOUNTER — Other Ambulatory Visit: Payer: Self-pay

## 2021-02-20 ENCOUNTER — Encounter: Payer: Self-pay | Admitting: Family Medicine

## 2021-02-20 VITALS — BP 125/80 | HR 73 | Temp 98.3°F | Wt 162.6 lb

## 2021-02-20 DIAGNOSIS — I10 Essential (primary) hypertension: Secondary | ICD-10-CM

## 2021-02-20 DIAGNOSIS — L0291 Cutaneous abscess, unspecified: Secondary | ICD-10-CM | POA: Diagnosis not present

## 2021-02-20 MED ORDER — DOXYCYCLINE HYCLATE 100 MG PO TABS: 100.0000 mg | ORAL_TABLET | Freq: Two times a day (BID) | ORAL | 0 refills | Status: DC

## 2021-02-20 MED ORDER — LISINOPRIL 5 MG PO TABS: 5.0000 mg | ORAL_TABLET | Freq: Every day | ORAL | 3 refills | Status: DC

## 2021-02-20 NOTE — Addendum Note (Signed)
Addended by: Grier Mitts R on: 02/20/2021 06:08 PM   Modules accepted: Orders

## 2021-02-20 NOTE — Progress Notes (Addendum)
Subjective:    Patient ID: Patricia Henry, female    DOB: 12-03-1951, 70 y.o.   MRN: [id number]  No chief complaint on file.   HPI Patient was seen today for cyst in vaginal area x 1 day.  Area tender, red, edematous, with increased warmth.  Pt tried warm bath.  Endorses history of Bartholin gland cyst in the past which required I&D.  Pt stopped taking lisinopril 10 mg and went back to 5 mg as the higher dose seemed to cause fatigue.  Past Medical History:  Diagnosis Date  . Benign essential blepharospasm   . Hypertension    no meds  . IBS (irritable bowel syndrome)   . Migraines   . UTI (urinary tract infection)     No Known Allergies  ROS General: Denies fever, chills, night sweats, changes in weight, changes in appetite HEENT: Denies headaches, ear pain, changes in vision, rhinorrhea, sore throat CV: Denies CP, palpitations, SOB, orthopnea Pulm: Denies SOB, cough, wheezing GI: Denies abdominal pain, nausea, vomiting, diarrhea, constipation GU: Denies dysuria, hematuria, frequency, vaginal discharge Msk: Denies muscle cramps, joint pains Neuro: Denies weakness, numbness, tingling Skin: Denies rashes, bruising  + painful bump in vaginal area Psych: Denies depression, anxiety, hallucinations     Objective:    Blood pressure 125/80, pulse 73, temperature 98.3 F (36.8 C), temperature source Oral, weight 162 lb 9.6 oz (73.8 kg), SpO2 98 %.  Gen. Pleasant, well-nourished, in no distress, normal affect   HEENT: [place]/AT, face symmetric, conjunctiva clear, no scleral icterus, PERRLA, EOMI, nares patent without drainage Lungs: no accessory muscle use Cardiovascular: RRR Musculoskeletal: No deformities, no cyanosis or clubbing, normal tone Neuro:  A&Ox3, CN II-XII intact, normal gait Skin:  Warm, dry, intact. Erythema, induration, and TTP of L medial buttock with fluctuance inferior to L labia and vagina.     Wt Readings from Last 3 Encounters:  02/20/21 162 lb 9.6 oz (73.8 kg)   12/26/20 157 lb 9.6 oz (71.5 kg)  07/04/20 150 lb (68 kg)    Lab Results  Component Value Date   WBC 4.1 10/01/2020   HGB 13.8 10/01/2020   HCT 42.1 10/01/2020   PLT 150 10/01/2020   GLUCOSE 124 (H) 10/01/2020   NA 141 10/01/2020   K 4.0 10/01/2020   CL 105 10/01/2020   CREATININE 0.83 10/01/2020   BUN 19 10/01/2020   CO2 29 10/01/2020   TSH 2.19 10/01/2020   HGBA1C 5.3 10/23/2014   Incision and Drainage Procedure Note  Pre-operative Diagnosis: Abscess Post-operative Diagnosis: same  Indications: As above  Anesthesia: 1% plain lidocaine  Procedure Details  The procedure, risks and complications have been discussed in detail (including, but not limited to airway compromise, infection, bleeding) with the patient, and the patient has signed consent to the procedure.  The skin was sterilely prepped and draped over the affected area in the usual fashion. After adequate local anesthesia, I&D with a #11 blade was performed on the left, medial buttock inferior to L labia majora. Purulent drainage: present.  Area packed with sterile gauze. The patient was observed until stable.   EBL: Minimal cc's   Condition: Tolerated procedure well and Stable   Complications: none.   Assessment/Plan:  Abscess -Consent obtained.  I&D performed.  Patient tolerated procedure well. -Area packed with sterile gauze.  Patient removed gauze on Sunday, 02/22/2021. -Continue supportive care including heat - Plan: doxycycline (VIBRA-TABS) 100 MG tablet  HTN -controlled -continue lisinopril 5 mg as 10 mg seemed to  cause fatigue. -refill sent to pharmacy F/u as needed  Grier Mitts, MD

## 2021-02-20 NOTE — Patient Instructions (Addendum)
Skin Abscess  A skin abscess is an infected area on or under your skin that contains a collection of pus and other material. An abscess may also be called a furuncle, carbuncle, or boil. An abscess can occur in or on almost any part of your body. Some abscesses break open (rupture) on their own. Most continue to get worse unless they are treated. The infection can spread deeper into the body and eventually into your blood, which can make you feel ill. Treatment usually involves draining the abscess. What are the causes? An abscess occurs when germs, like bacteria, pass through your skin and cause an infection. This may be caused by:  A scrape or cut on your skin.  A puncture wound through your skin, including a needle injection or insect bite.  Blocked oil or sweat glands.  Blocked and infected hair follicles.  A cyst that forms beneath your skin (sebaceous cyst) and becomes infected. What increases the risk? This condition is more likely to develop in people who:  Have a weak body defense system (immune system).  Have diabetes.  Have dry and irritated skin.  Get frequent injections or use illegal IV drugs.  Have a foreign body in a wound, such as a splinter.  Have problems with their lymph system or veins. What are the signs or symptoms? Symptoms of this condition include:  A painful, firm bump under the skin.  A bump with pus at the top. This may break through the skin and drain. Other symptoms include:  Redness surrounding the abscess site.  Warmth.  Swelling of the lymph nodes (glands) near the abscess.  Tenderness.  A sore on the skin. How is this diagnosed? This condition may be diagnosed based on:  A physical exam.  Your medical history.  A sample of pus. This may be used to find out what is causing the infection.  Blood tests.  Imaging tests, such as an ultrasound, CT scan, or MRI. How is this treated? A small abscess that drains on its own may not  need treatment. Treatment for larger abscesses may include:  Moist heat or heat pack applied to the area several times a day.  A procedure to drain the abscess (incision and drainage).  Antibiotic medicines. For a severe abscess, you may first get antibiotics through an IV and then change to antibiotics by mouth. Follow these instructions at home: Medicines  Take over-the-counter and prescription medicines only as told by your health care provider.  If you were prescribed an antibiotic medicine, take it as told by your health care provider. Do not stop taking the antibiotic even if you start to feel better.   Abscess care  If you have an abscess that has not drained, apply heat to the affected area. Use the heat source that your health care provider recommends, such as a moist heat pack or a heating pad. ? Place a towel between your skin and the heat source. ? Leave the heat on for 20-30 minutes. ? Remove the heat if your skin turns bright red. This is especially important if you are unable to feel pain, heat, or cold. You may have a greater risk of getting burned.  Follow instructions from your health care provider about how to take care of your abscess. Make sure you: ? Cover the abscess with a bandage (dressing). ? Change your dressing or gauze as told by your health care provider. ? Wash your hands with soap and water before you change the   dressing or gauze. If soap and water are not available, use hand sanitizer.  Check your abscess every day for signs of a worsening infection. Check for: ? More redness, swelling, or pain. ? More fluid or blood. ? Warmth. ? More pus or a bad smell.   General instructions  To avoid spreading the infection: ? Do not share personal care items, towels, or hot tubs with others. ? Avoid making skin contact with other people.  Keep all follow-up visits as told by your health care provider. This is important. Contact a health care provider if you  have:  More redness, swelling, or pain around your abscess.  More fluid or blood coming from your abscess.  Warm skin around your abscess.  More pus or a bad smell coming from your abscess.  A fever.  Muscle aches.  Chills or a general ill feeling. Get help right away if you:  Have severe pain.  See red streaks on your skin spreading away from the abscess. Summary  A skin abscess is an infected area on or under your skin that contains a collection of pus and other material.  A small abscess that drains on its own may not need treatment.  Treatment for larger abscesses may include having a procedure to drain the abscess and taking an antibiotic. This information is not intended to replace advice given to you by your health care provider. Make sure you discuss any questions you have with your health care provider. Document Revised: 03/29/2019 Document Reviewed: 01/19/2018 Elsevier Patient Education  2021 Else[employer] Inc.  Incision and Drainage, Care After This sheet gives you information about how to care for yourself after your procedure. Your health care provider may also give you more specific instructions. If you have problems or questions, contact your health care provider. What can I expect after the procedure? After the procedure, it is common to have:  Pain or discomfort around the incision site.  Blood, fluid, or pus (drainage) from the incision.  Redness and firm skin around the incision site. Follow these instructions at home: Medicines  Take over-the-counter and prescription medicines only as told by your health care provider.  If you were prescribed an antibiotic medicine, use or take it as told by your health care provider. Do not stop using the antibiotic even if you start to feel better. Wound care Follow instructions from your health care provider about how to take care of your wound. Make sure you:  Wash your hands with soap and water before and after  you change your bandage (dressing). If soap and water are not available, use hand sanitizer.  Change your dressing and packing as told by your health care provider. ? If your dressing is dry or stuck when you try to remove it, moisten or wet the dressing with saline or water so that it can be removed without harming your skin or tissues. ? If your wound is packed, leave it in place until your health care provider tells you to remove it. To remove the packing, moisten or wet the packing with saline or water so that it can be removed without harming your skin or tissues.  Leave stitches (sutures), skin glue, or adhesive strips in place. These skin closures may need to stay in place for 2 weeks or longer. If adhesive strip edges start to loosen and curl up, you may trim the loose edges. Do not remove adhesive strips completely unless your health care provider tells you to do that.   Check your wound every day for signs of infection. Check for:  More redness, swelling, or pain.  More fluid or blood.  Warmth.  Pus or a bad smell. If you were sent home with a drain tube in place, follow instructions from your health care provider about:  How to empty it.  How to care for it at home.   General instructions  Rest the affected area.  Do not take baths, swim, or use a hot tub until your health care provider approves. Ask your health care provider if you may take showers. You may only be allowed to take sponge baths.  Return to your normal activities as told by your health care provider. Ask your health care provider what activities are safe for you. Your health care provider may put you on activity or lifting restrictions.  The incision will continue to drain. It is normal to have some clear or slightly bloody drainage. The amount of drainage should lessen each day.  Do not apply any creams, ointments, or liquids unless you have been told to by your health care provider.  Keep all follow-up  visits as told by your health care provider. This is important. Contact a health care provider if:  Your cyst or abscess returns.  You have a fever or chills.  You have more redness, swelling, or pain around your incision.  You have more fluid or blood coming from your incision.  Your incision feels warm to the touch.  You have pus or a bad smell coming from your incision.  You have red streaks above or below the incision site. Get help right away if:  You have severe pain or bleeding.  You cannot eat or drink without vomiting.  You have decreased urine output.  You become short of breath.  You have chest pain.  You cough up blood.  The affected area becomes numb or starts to tingle. These symptoms may represent a serious problem that is an emergency. Do not wait to see if the symptoms will go away. Get medical help right away. Call your local emergency services (911 in the [place] Do not drive yourself to the hospital. Summary  After this procedure, it is common to have fluid, blood, or pus coming from the surgery site.  Follow all home care instructions. You will be told how to take care of your incision, how to check for infection, and how to take medicines.  If you were prescribed an antibiotic medicine, take it as told by your health care provider. Do not stop taking the antibiotic even if you start to feel better.  Contact a health care provider if you have increased redness, swelling, or pain around your incision. Get help right away if you have chest pain, you vomit, you cough up blood, or you have shortness of breath.  Keep all follow-up visits as told by your health care provider. This is important. This information is not intended to replace advice given to you by your health care provider. Make sure you discuss any questions you have with your health care provider. Document Revised: 11/06/2018 Document Reviewed: 11/06/2018 Elsevier Patient Education  2021  Elsevier Inc.  

## 2021-03-05 ENCOUNTER — Other Ambulatory Visit: Payer: Self-pay

## 2021-03-05 ENCOUNTER — Ambulatory Visit (INDEPENDENT_AMBULATORY_CARE_PROVIDER_SITE_OTHER): Payer: Medicare Other | Admitting: Family Medicine

## 2021-03-05 ENCOUNTER — Encounter: Payer: Self-pay | Admitting: Family Medicine

## 2021-03-05 VITALS — BP 140/88 | HR 77 | Temp 98.2°F | Wt 162.2 lb

## 2021-03-05 DIAGNOSIS — L509 Urticaria, unspecified: Secondary | ICD-10-CM | POA: Diagnosis not present

## 2021-03-05 DIAGNOSIS — G245 Blepharospasm: Secondary | ICD-10-CM | POA: Diagnosis not present

## 2021-03-05 DIAGNOSIS — F4329 Adjustment disorder with other symptoms: Secondary | ICD-10-CM

## 2021-03-05 DIAGNOSIS — I10 Essential (primary) hypertension: Secondary | ICD-10-CM

## 2021-03-05 MED ORDER — LISINOPRIL 10 MG PO TABS: 5.0000 mg | ORAL_TABLET | Freq: Every day | ORAL | 3 refills | Status: DC

## 2021-03-05 MED ORDER — HYDROCORTISONE 2.5 % EX CREA: TOPICAL_CREAM | Freq: Two times a day (BID) | CUTANEOUS | 0 refills | Status: DC

## 2021-03-05 NOTE — Progress Notes (Signed)
Subjective:    Patient ID: Patricia Henry, female    DOB: 12/18/1951, 70 y.o.   MRN: [id number]  Chief Complaint  Patient presents with  . Rash    On sides of abdomen    HPI Patient was seen today for acute concern. Pt with pruritic rash on b/l flanks x a few days.  Pt denies changes in soaps, lotions, or detergents.  Pt also with elevated bp, 142/81, 156/77, 158/92 at home.  Pt notes becoming anxious when checking bp.  The memorial for her husband is coming up in the next few wks.  Pt taking klonopin prn for blepharospasm.  Past Medical History:  Diagnosis Date  . Benign essential blepharospasm   . Hypertension    no meds  . IBS (irritable bowel syndrome)   . Migraines   . UTI (urinary tract infection)     No Known Allergies  ROS General: Denies fever, chills, night sweats, changes in weight, changes in appetite    HEENT: Denies headaches, ear pain, changes in vision, rhinorrhea, sore throat CV: Denies CP, palpitations, SOB, orthopnea Pulm: Denies SOB, cough, wheezing GI: Denies abdominal pain, nausea, vomiting, diarrhea, constipation GU: Denies dysuria, hematuria, frequency, vaginal discharge Msk: Denies muscle cramps, joint pains Neuro: Denies weakness, numbness, tingling Skin: Denies bruising  +rash Psych: Denies depression, hallucinations  +anxiety     Objective:    Blood pressure 140/88, pulse 77, temperature 98.2 F (36.8 C), temperature source Oral, weight 162 lb 3.2 oz (73.6 kg), SpO2 93 %.  Gen. Pleasant, well-nourished, in no distress, normal affect   HEENT: [place]/AT, face symmetric, conjunctiva clear, no scleral icterus, PERRLA, EOMI, nares patent without drainage Lungs: no accessory muscle use, CTAB, no wheezes or rales Cardiovascular: RRR, no m/r/g, no peripheral edema Abdomen: BS present, soft, NT/ND, no hepatosplenomegaly. Musculoskeletal: No deformities, no cyanosis or clubbing, normal tone Neuro:  A&Ox3, CN II-XII intact, normal gait Skin:  Warm, no  lesions.  Erythematous rash on b/l flank.   Wt Readings from Last 3 Encounters:  03/05/21 162 lb 3.2 oz (73.6 kg)  02/20/21 162 lb 9.6 oz (73.8 kg)  12/26/20 157 lb 9.6 oz (71.5 kg)    Lab Results  Component Value Date   WBC 4.1 10/01/2020   HGB 13.8 10/01/2020   HCT 42.1 10/01/2020   PLT 150 10/01/2020   GLUCOSE 124 (H) 10/01/2020   NA 141 10/01/2020   K 4.0 10/01/2020   CL 105 10/01/2020   CREATININE 0.83 10/01/2020   BUN 19 10/01/2020   CO2 29 10/01/2020   TSH 2.19 10/01/2020   HGBA1C 5.3 10/23/2014    Assessment/Plan:  Hives  -possibly related to stress - Plan: hydrocortisone 2.5 % cream  Essential hypertension -elevated -increased lisinopril from 5 mg to 10 mg -consider checking bp less frequently if increases anxiety  - Plan: lisinopril (ZESTRIL) 10 MG tablet  Adjustment disorder with other symptoms/Grief -continue therapy -consider scheduling klonopin in the days prior to memorial for pt's husband -continue self care -continue Zoloft 100 mg daily  Blepharospasm of both eyes -continue klonopin 0.5 mg -continue f/u with Ophthalmology, Dr. Ramonita Lab  F/u prn  Grier Mitts, MD

## 2021-03-22 ENCOUNTER — Encounter: Payer: Self-pay | Admitting: Family Medicine

## 2021-03-26 DIAGNOSIS — Z23 Encounter for immunization: Secondary | ICD-10-CM | POA: Diagnosis not present

## 2021-03-30 DIAGNOSIS — J300 Vasomotor rhinitis: Secondary | ICD-10-CM | POA: Diagnosis not present

## 2021-04-15 ENCOUNTER — Other Ambulatory Visit: Payer: Self-pay | Admitting: Family Medicine

## 2021-04-15 DIAGNOSIS — L509 Urticaria, unspecified: Secondary | ICD-10-CM

## 2021-04-25 DIAGNOSIS — Z20822 Contact with and (suspected) exposure to covid-19: Secondary | ICD-10-CM | POA: Diagnosis not present

## 2021-05-04 DIAGNOSIS — Z1231 Encounter for screening mammogram for malignant neoplasm of breast: Secondary | ICD-10-CM | POA: Diagnosis not present

## 2021-05-04 LAB — HM MAMMOGRAPHY

## 2021-05-07 ENCOUNTER — Other Ambulatory Visit: Payer: Self-pay | Admitting: Family Medicine

## 2021-05-07 DIAGNOSIS — F4329 Adjustment disorder with other symptoms: Secondary | ICD-10-CM

## 2021-05-12 ENCOUNTER — Encounter: Payer: Self-pay | Admitting: Family Medicine

## 2021-05-25 ENCOUNTER — Encounter: Payer: Self-pay | Admitting: Family Medicine

## 2021-05-25 ENCOUNTER — Telehealth (INDEPENDENT_AMBULATORY_CARE_PROVIDER_SITE_OTHER): Payer: Medicare Other | Admitting: Family Medicine

## 2021-05-25 DIAGNOSIS — B948 Sequelae of other specified infectious and parasitic diseases: Secondary | ICD-10-CM | POA: Diagnosis not present

## 2021-05-25 DIAGNOSIS — R5383 Other fatigue: Secondary | ICD-10-CM

## 2021-05-25 DIAGNOSIS — U099 Post covid-19 condition, unspecified: Secondary | ICD-10-CM

## 2021-05-25 NOTE — Progress Notes (Signed)
Virtual Visit via Video Note  I connected with Patricia Henry on 05/25/21 at  4:00 PM EDT by a video enabled telemedicine application 2/2 [id number] pandemic and verified that I am speaking with the correct person using two identifiers.  Location patient: home Location provider:work or home office Persons participating in the virtual visit: patient, provider  I discussed the limitations of evaluation and management by telemedicine and the availability of in person appointments. The patient expressed understanding and agreed to proceed.   HPI: Pt had Covid on 5/5.  She has continued fatigue and brain fog ever since.  Pt states she has errands to run, but does not have the energy to drive. States the fatigue has been worse than actually having COVID.  Pt is hoping to get to [facility] for a memorial, for her husband Patricia Henry, in 2 wks.  Was able to go to one in [place], [place] without issue prior to getting sick.  Also had one in [place] in the garden at [employer] that turned out well.  ROS: See pertinent positives and negatives per HPI.  Past Medical History:  Diagnosis Date  . Benign essential blepharospasm   . Hypertension    no meds  . IBS (irritable bowel syndrome)   . Migraines   . UTI (urinary tract infection)     Past Surgical History:  Procedure Laterality Date  . BLEPHAROPLASTY    . EYE SURGERY  2014   SLK/ eyelid surgery/ Bil eyes  . ovary removed     right side/ over 20 years    Family History  Problem Relation Age of Onset  . Alzheimer's disease Mother   . Heart failure Mother   . Arthritis Mother   . Depression Mother   . Diabetes Mother   . Hearing loss Father   . Heart attack Father   . Arthritis Sister   . Hypertension Brother   . Arthritis Maternal Grandmother      Current Outpatient Medications:  .  azelastine (ASTELIN) 0.1 % nasal spray, Place into both nostrils 2 (two) times daily. Use in each nostril as directed, Disp: , Rfl:  .  Calcium  Carb-Cholecalciferol (CALCIUM 600 + D PO), Take by mouth 2 (two) times daily., Disp: , Rfl:  .  clonazePAM (KLONOPIN) 0.5 MG tablet, Take 1 tablet (0.5 mg total) by mouth 2 (two) times daily as needed for anxiety., Disp: 60 tablet, Rfl: 0 .  doxycycline (VIBRA-TABS) 100 MG tablet, Take 1 tablet (100 mg total) by mouth 2 (two) times daily., Disp: 20 tablet, Rfl: 0 .  hydrocortisone 2.5 % cream, APPLY TOPICALLY TWICE A DAY, Disp: 28 g, Rfl: 1 .  lisinopril (ZESTRIL) 10 MG tablet, Take 0.5 tablets (5 mg total) by mouth daily., Disp: 30 tablet, Rfl: 3 .  methylphenidate (RITALIN) 10 MG tablet, Take 10 mg by mouth as needed., Disp: , Rfl:  .  OnabotulinumtoxinA (BOTOX IJ), Inject as directed. Inject into eyelids every 90 days, Disp: , Rfl:  .  Phenylephrine HCl (AFRIN ALLERGY NA), Place into the nose as needed. Diluted with ocean nasal spray, Disp: , Rfl:  .  sertraline (ZOLOFT) 100 MG tablet, TAKE 1 TABLET BY MOUTH EVERY DAY, Disp: 90 tablet, Rfl: 0  EXAM:  VITALS per patient if applicable: BP 454/09,  HR 78    GENERAL: alert, oriented, appears well and in no acute distress  HEENT: atraumatic, conjunctiva clear, no obvious abnormalities on inspection of external nose and ears  NECK: normal movements of  the head and neck  LUNGS: on inspection no signs of respiratory distress, breathing rate appears normal, no obvious gross SOB, gasping or wheezing  CV: no obvious cyanosis  MS: moves all visible extremities without noticeable abnormality  PSYCH/NEURO: pleasant and cooperative, no obvious depression or anxiety, speech and thought processing grossly intact  ASSESSMENT AND PLAN:  Discussed the following assessment and plan:  Persistent fatigue after COVID-19 -discussed various treatment options including vitamin D and C, Zn, and Magnesium supplements -also discussed use of stimulants for fatigue.  Pt will try Ritalin 10 mgas she was previously on it for blepharospasm and still has some at  home. -will continue to monitor  F/u prn   I discussed the assessment and treatment plan with the patient. The patient was provided an opportunity to ask questions and all were answered. The patient agreed with the plan and demonstrated an understanding of the instructions.   The patient was advised to call back or seek an in-person evaluation if the symptoms worsen or if the condition fails to improve as anticipated.    Billie Ruddy, MD

## 2021-06-10 DIAGNOSIS — G245 Blepharospasm: Secondary | ICD-10-CM | POA: Diagnosis not present

## 2021-06-29 ENCOUNTER — Other Ambulatory Visit: Payer: Self-pay

## 2021-06-29 ENCOUNTER — Ambulatory Visit (INDEPENDENT_AMBULATORY_CARE_PROVIDER_SITE_OTHER): Payer: Medicare Other | Admitting: Family Medicine

## 2021-06-29 VITALS — BP 118/68 | HR 62 | Temp 98.4°F | Wt 163.2 lb

## 2021-06-29 DIAGNOSIS — I10 Essential (primary) hypertension: Secondary | ICD-10-CM | POA: Diagnosis not present

## 2021-06-29 DIAGNOSIS — G245 Blepharospasm: Secondary | ICD-10-CM

## 2021-06-29 DIAGNOSIS — B948 Sequelae of other specified infectious and parasitic diseases: Secondary | ICD-10-CM

## 2021-06-29 DIAGNOSIS — F419 Anxiety disorder, unspecified: Secondary | ICD-10-CM

## 2021-06-29 DIAGNOSIS — R5383 Other fatigue: Secondary | ICD-10-CM

## 2021-06-29 DIAGNOSIS — Z1159 Encounter for screening for other viral diseases: Secondary | ICD-10-CM

## 2021-06-29 DIAGNOSIS — Z833 Family history of diabetes mellitus: Secondary | ICD-10-CM | POA: Diagnosis not present

## 2021-06-29 DIAGNOSIS — R7309 Other abnormal glucose: Secondary | ICD-10-CM | POA: Diagnosis not present

## 2021-06-29 LAB — COMPREHENSIVE METABOLIC PANEL
ALT: 19 U/L (ref 0–35)
AST: 17 U/L (ref 0–37)
Albumin: 4.2 g/dL (ref 3.5–5.2)
Alkaline Phosphatase: 65 U/L (ref 39–117)
BUN: 22 mg/dL (ref 6–23)
CO2: 29 mEq/L (ref 19–32)
Calcium: 8.9 mg/dL (ref 8.4–10.5)
Chloride: 105 mEq/L (ref 96–112)
Creatinine, Ser: 0.73 mg/dL (ref 0.40–1.20)
GFR: 82.72 mL/min (ref >60.00)
Glucose, Bld: 81 mg/dL (ref 70–99)
Potassium: 4.3 mEq/L (ref 3.5–5.1)
Sodium: 141 mEq/L (ref 135–145)
Total Bilirubin: 0.5 mg/dL (ref 0.2–1.2)
Total Protein: 6.4 g/dL (ref 6.0–8.3)

## 2021-06-29 LAB — POCT GLYCOSYLATED HEMOGLOBIN (HGB A1C): Hemoglobin A1C: 5 % (ref 4.0–5.6)

## 2021-06-29 LAB — CBC WITH DIFFERENTIAL/PLATELET
Basophils Absolute: 0 10*3/uL (ref 0.0–0.1)
Basophils Relative: 0.9 % (ref 0.0–3.0)
Eosinophils Absolute: 0.1 10*3/uL (ref 0.0–0.7)
Eosinophils Relative: 1.5 % (ref 0.0–5.0)
HCT: 40.5 % (ref 36.0–46.0)
Hemoglobin: 13.5 g/dL (ref 12.0–15.0)
Lymphocytes Relative: 34.1 % (ref 12.0–46.0)
Lymphs Abs: 1.2 10*3/uL (ref 0.7–4.0)
MCHC: 33.3 g/dL (ref 30.0–36.0)
MCV: 89 fl (ref 78.0–100.0)
Monocytes Absolute: 0.4 10*3/uL (ref 0.1–1.0)
Monocytes Relative: 9.9 % (ref 3.0–12.0)
Neutro Abs: 1.9 10*3/uL (ref 1.4–7.7)
Neutrophils Relative %: 53.6 % (ref 43.0–77.0)
Platelets: 165 10*3/uL (ref 150.0–400.0)
RBC: 4.54 Mil/uL (ref 3.87–5.11)
RDW: 13.9 % (ref 11.5–15.5)
WBC: 3.6 10*3/uL — ABNORMAL LOW (ref 4.0–10.5)

## 2021-06-29 LAB — TSH: TSH: 2.88 u[IU]/mL (ref 0.35–5.50)

## 2021-06-29 LAB — T4, FREE: Free T4: 0.71 ng/dL (ref 0.60–1.60)

## 2021-06-29 LAB — VITAMIN B12: Vitamin B-12: 1455 pg/mL — ABNORMAL HIGH (ref 211–911)

## 2021-06-29 MED ORDER — AMLODIPINE BESYLATE 2.5 MG PO TABS: 2.5000 mg | ORAL_TABLET | Freq: Every day | ORAL | 3 refills | Status: DC

## 2021-06-30 LAB — HEPATITIS C ANTIBODY
Hepatitis C Ab: NONREACTIVE
SIGNAL TO CUT-OFF: 0.01 (ref <1.00)

## 2021-07-02 ENCOUNTER — Encounter: Payer: Self-pay | Admitting: Family Medicine

## 2021-07-03 ENCOUNTER — Encounter: Payer: Self-pay | Admitting: Family Medicine

## 2021-07-14 ENCOUNTER — Encounter: Payer: Self-pay | Admitting: Family Medicine

## 2021-07-14 DIAGNOSIS — L565 Disseminated superficial actinic porokeratosis (DSAP): Secondary | ICD-10-CM | POA: Diagnosis not present

## 2021-07-14 DIAGNOSIS — D2261 Melanocytic nevi of right upper limb, including shoulder: Secondary | ICD-10-CM | POA: Diagnosis not present

## 2021-07-14 DIAGNOSIS — D2272 Melanocytic nevi of left lower limb, including hip: Secondary | ICD-10-CM | POA: Diagnosis not present

## 2021-07-14 DIAGNOSIS — D485 Neoplasm of uncertain behavior of skin: Secondary | ICD-10-CM | POA: Diagnosis not present

## 2021-07-14 DIAGNOSIS — L820 Inflamed seborrheic keratosis: Secondary | ICD-10-CM | POA: Diagnosis not present

## 2021-07-14 DIAGNOSIS — L918 Other hypertrophic disorders of the skin: Secondary | ICD-10-CM | POA: Diagnosis not present

## 2021-07-14 DIAGNOSIS — D225 Melanocytic nevi of trunk: Secondary | ICD-10-CM | POA: Diagnosis not present

## 2021-07-14 DIAGNOSIS — L503 Dermatographic urticaria: Secondary | ICD-10-CM | POA: Diagnosis not present

## 2021-07-14 DIAGNOSIS — D2262 Melanocytic nevi of left upper limb, including shoulder: Secondary | ICD-10-CM | POA: Diagnosis not present

## 2021-07-14 DIAGNOSIS — L814 Other melanin hyperpigmentation: Secondary | ICD-10-CM | POA: Diagnosis not present

## 2021-07-14 DIAGNOSIS — C44311 Basal cell carcinoma of skin of nose: Secondary | ICD-10-CM | POA: Diagnosis not present

## 2021-07-14 DIAGNOSIS — D2362 Other benign neoplasm of skin of left upper limb, including shoulder: Secondary | ICD-10-CM | POA: Diagnosis not present

## 2021-07-14 NOTE — Progress Notes (Signed)
Subjective:    Patient ID: Patricia Henry, female    DOB: 02-06-1951, 70 y.o.   MRN: [id number]  Chief Complaint  Patient presents with   Fatigue    Post covid fatigue, has no energy and is always tired. Felt really bad on Thursday, feels like she is not getting better, but going backwards    HPI Patient was seen today for ongoing concern. Pt with fatigue and brain fog s/p COVID-19 infection on 04/23/21.  Unsure if Lisinopril 5 mg is contributing, but feels brain fog in am after taking med.  Also notes feeling dizzy and weak at times with increased anxiety and blepharospasms.  Past Medical History:  Diagnosis Date   Benign essential blepharospasm    Hypertension    no meds   IBS (irritable bowel syndrome)    Migraines    UTI (urinary tract infection)     No Known Allergies  ROS General: Denies fever, chills, night sweats, changes in weight, changes in appetite + fatigue, weakness, dizziness HEENT: Denies headaches, ear pain, changes in vision, rhinorrhea, sore throat + blepharospasms  CV: Denies CP, palpitations, SOB, orthopnea Pulm: Denies SOB, cough, wheezing GI: Denies abdominal pain, nausea, vomiting, diarrhea, constipation GU: Denies dysuria, hematuria, frequency, vaginal discharge Msk: Denies muscle cramps, joint pains Neuro: Denies weakness, numbness, tingling Skin: Denies rashes, bruising Psych: Denies depression, hallucinations + anxiety    Objective:    Blood pressure 118/68, pulse 62, temperature 98.4 F (36.9 C), temperature source Oral, weight 163 lb 3.2 oz (74 kg), SpO2 98 %.  Gen. Pleasant, well-nourished, in no distress, normal affect   HEENT: [place]/AT, face symmetric, conjunctiva clear, no scleral icterus, PERRLA, EOMI, nares patent without drainage Lungs: no accessory muscle use Cardiovascular: RRR, no peripheral edema Musculoskeletal: No deformities, no cyanosis or clubbing, normal tone Neuro:  A&Ox3, CN II-XII intact, normal gait Skin:  Warm, no lesions/  rash   Wt Readings from Last 3 Encounters:  06/29/21 163 lb 3.2 oz (74 kg)  03/05/21 162 lb 3.2 oz (73.6 kg)  02/20/21 162 lb 9.6 oz (73.8 kg)    Lab Results  Component Value Date   WBC 3.6 (L) 06/29/2021   HGB 13.5 06/29/2021   HCT 40.5 06/29/2021   PLT 165.0 06/29/2021   GLUCOSE 81 06/29/2021   ALT 19 06/29/2021   AST 17 06/29/2021   NA 141 06/29/2021   K 4.3 06/29/2021   CL 105 06/29/2021   CREATININE 0.73 06/29/2021   BUN 22 06/29/2021   CO2 29 06/29/2021   TSH 2.88 06/29/2021   HGBA1C 5.0 06/29/2021    Assessment/Plan:  Persistent fatigue after COVID-19  -Fatigue and brain fog s/p COVID infection 04/23/2021 -Continue multivitamin -Discussed taking Ritalin 2.5 mg daily for brain fog symptoms.  Patient has a prescription for Reglan as also uses for blepharospasms. - Plan: CBC with Differential/Platelet, TSH, T4, Free, CMP, Vitamin B12  Blepharospasm of both eyes -Discussed importance of decreasing stress -Continue clonazepam 0.5 mg and Ritalin 2.5 mg as needed for symptoms -Continue follow-up with ophthalmology, Dr. Ramonita Lab  Essential hypertension  -Controlled -We will discontinue lisinopril 5 mg as pt concern may be contributing to fatigue -Start Norvasc 2.5 mg daily -Monitor BP.  Adjust meds as needed. - Plan: amLODipine (NORVASC) 2.5 MG tablet, CMP  Anxiety  -Discussed self-care and ways to destress -Continue counseling and support groups - Plan: TSH, T4, Free  Family history of diabetes mellitus (DM)  - Plan: POCT glycosylated hemoglobin (Hb A1C)  Encounter  for hepatitis C screening test for low risk patient  - Plan: Hep C Antibody  Elevated glucose - Plan: POCT glycosylated hemoglobin (Hb A1C)  F/u prn in 1 month  Grier Mitts, MD

## 2021-07-25 ENCOUNTER — Ambulatory Visit (HOSPITAL_COMMUNITY)
Admission: RE | Admit: 2021-07-25 | Discharge: 2021-07-25 | Disposition: A | Discharge status: Home / Self Care | Payer: Medicare Other | Source: Ambulatory Visit | Attending: Medical Oncology | Admitting: Medical Oncology

## 2021-07-25 ENCOUNTER — Encounter (HOSPITAL_COMMUNITY): Payer: Self-pay

## 2021-07-25 ENCOUNTER — Other Ambulatory Visit: Payer: Self-pay

## 2021-07-25 VITALS — BP 156/71 | HR 78 | Temp 98.2°F | Resp 18

## 2021-07-25 DIAGNOSIS — W5503XA Scratched by cat, initial encounter: Secondary | ICD-10-CM

## 2021-07-25 DIAGNOSIS — L03113 Cellulitis of right upper limb: Secondary | ICD-10-CM

## 2021-07-25 MED ORDER — AMOXICILLIN-POT CLAVULANATE 875-125 MG PO TABS: 1.0000 | ORAL_TABLET | Freq: Two times a day (BID) | ORAL | 0 refills | Status: DC

## 2021-07-25 NOTE — ED Triage Notes (Signed)
Pt presents with cat scratch from her cat on her right elbow that happened yesterday; it is her cat and is up to date on vaccines.  Area is swollen, red, and warm to touch.

## 2021-07-25 NOTE — ED Provider Notes (Addendum)
[facility]    CSN: [id number] Arrival date & time: 07/25/21  1344      History   Chief Complaint Chief Complaint  Patient presents with   APPOITNTMENT: Cat Scratch    HPI Patricia Henry is a 70 y.o. female.   HPI  Cat Bite: Patient reports that last night her cat bit her as she was trying to stop it from going outside.  The bite occurred on her right forearm.  She has not applied anything to it but has tried to keep it clean.  She denies any vomiting or fevers.  She reports that the cat is hers and up-to-date on his vaccines.  She is concerned about the amount of swelling and redness that she is having at the site.  She reports being up-to-date on her Tdap vaccine.  Past Medical History:  Diagnosis Date   Benign essential blepharospasm    Hypertension    no meds   IBS (irritable bowel syndrome)    Migraines    UTI (urinary tract infection)     Patient Active Problem List   Diagnosis Date Noted   Blepharospasm of both eyes 01/11/2021   Essential hypertension 01/11/2021   Adjustment disorder with other symptoms 07/04/2020    Past Surgical History:  Procedure Laterality Date   BLEPHAROPLASTY     EYE SURGERY  2014   SLK/ eyelid surgery/ Bil eyes   ovary removed     right side/ over 20 years    OB History     Gravida  1   Para      Term      Preterm      AB  1   Living  0      SAB      IAB  1   Ectopic      Multiple      Live Births  0            Home Medications    Prior to Admission medications   Medication Sig Start Date End Date Taking? Authorizing Provider  amoxicillin-clavulanate (AUGMENTIN) 875-125 MG tablet Take 1 tablet by mouth every 12 (twelve) hours. 07/25/21  Yes Nelwyn Salisbury M, PA-C  amLODipine (NORVASC) 2.5 MG tablet Take 1 tablet (2.5 mg total) by mouth daily. 06/29/21   Billie Ruddy, MD  azelastine (ASTELIN) 0.1 % nasal spray Place into both nostrils 2 (two) times daily. Use in each nostril as directed     [provider]  Calcium Carb-Cholecalciferol (CALCIUM 600 + D PO) Take by mouth 2 (two) times daily.    [provider]  clonazePAM (KLONOPIN) 0.5 MG tablet Take 1 tablet (0.5 mg total) by mouth 2 (two) times daily as needed for anxiety. 04/15/20   Billie Ruddy, MD  clonazePAM (KLONOPIN) 0.5 MG tablet clonazepam 0.5 mg tablet  Take 1 tablet twice a day by oral route as needed.    [provider]  hydrocortisone 2.5 % cream APPLY TOPICALLY TWICE A DAY 04/15/21   Billie Ruddy, MD  methylphenidate (RITALIN) 10 MG tablet Take 10 mg by mouth as needed.    [provider]  OnabotulinumtoxinA (BOTOX IJ) Inject as directed. Inject into eyelids every 90 days    [provider]  Phenylephrine HCl (AFRIN ALLERGY NA) Place into the nose as needed. Diluted with ocean nasal spray    [provider]  sertraline (ZOLOFT) 100 MG tablet TAKE 1 TABLET BY MOUTH EVERY DAY 05/07/21  Billie Ruddy, MD    Family History Family History  Problem Relation Age of Onset   Alzheimer's disease Mother    Heart failure Mother    Arthritis Mother    Depression Mother    Diabetes Mother    Hearing loss Father    Heart attack Father    Arthritis Sister    Hypertension Brother    Arthritis Maternal Grandmother     Social History Social History   Tobacco Use   Smoking status: Former    Types: Cigarettes    Quit date: 05/23/2013    Years since quitting: 8.1   Smokeless tobacco: Never  Vaping Use   Vaping Use: Never used  Substance Use Topics   Alcohol use: Yes    Alcohol/week: 7.0 standard drinks    Types: 7 Glasses of wine per week    Comment: week   Drug use: No     Allergies   Patient has no known allergies.   Review of Systems Review of Systems  As stated above in HPI Physical Exam Triage Vital Signs ED Triage Vitals [07/25/21 1432]  Enc Vitals Group     BP (!) 156/71     Pulse Rate 78     Resp 18     Temp 98.2 F (36.8 C)      Temp Source Oral     SpO2 96 %     Weight      Height      Head Circumference      Peak Flow      Pain Score 5     Pain Loc      Pain Edu?      Excl. in [place]?    No data found.  Updated Vital Signs BP (!) 156/71 (BP Location: Right Arm)   Pulse 78   Temp 98.2 F (36.8 C) (Oral)   Resp 18   SpO2 96%   Physical Exam Vitals and nursing note reviewed.  Constitutional:      General: She is not in acute distress.    Appearance: Normal appearance. She is not ill-appearing, toxic-appearing or diaphoretic.  Musculoskeletal:        General: Swelling and tenderness present. Normal range of motion.  Skin:    General: Skin is warm.     Findings: Erythema present.     Comments: See below  Neurological:     General: No focal deficit present.     Mental Status: She is alert and oriented to person, place, and time.            UC Treatments / Results  Labs (all labs ordered are listed, but only abnormal results are displayed) Labs Reviewed - No data to display  EKG   Radiology No results found.  Procedures Procedures (including critical care time)  Medications Ordered in UC Medications - No data to display  Initial Impression / Assessment and Plan / UC Course  I have reviewed the triage vital signs and the nursing notes.  Pertinent labs & imaging results that were available during my care of the patient were reviewed by me and considered in my medical decision making (see chart for details).     New.  Treating for cellulitis with Augmentin to prevent further systemic complication.  Discussed how to use this medication along with common potential side effects and precautions.  We have outlined that this and time stamped it so that she can monitor for any progression of erythema and  swelling.  Discussed red flag signs and symptoms.   Final Clinical Impressions(s) / UC Diagnoses   Final diagnoses:  Cellulitis of right upper extremity  Cat scratch   Discharge  Instructions   None    ED Prescriptions     Medication Sig Dispense Auth. Provider   amoxicillin-clavulanate (AUGMENTIN) 875-125 MG tablet Take 1 tablet by mouth every 12 (twelve) hours. 14 tablet Hughie Closs, [place]      PDMP not reviewed this encounter.   Hughie Closs, PA-C 07/25/21 [place], PA-C 07/25/21 1515

## 2021-08-11 DIAGNOSIS — C44311 Basal cell carcinoma of skin of nose: Secondary | ICD-10-CM | POA: Diagnosis not present

## 2021-08-11 DIAGNOSIS — Z85828 Personal history of other malignant neoplasm of skin: Secondary | ICD-10-CM | POA: Diagnosis not present

## 2021-08-13 ENCOUNTER — Other Ambulatory Visit: Payer: Self-pay | Admitting: Family Medicine

## 2021-08-13 DIAGNOSIS — F4329 Adjustment disorder with other symptoms: Secondary | ICD-10-CM

## 2021-08-27 ENCOUNTER — Ambulatory Visit (INDEPENDENT_AMBULATORY_CARE_PROVIDER_SITE_OTHER): Payer: Medicare Other

## 2021-08-27 DIAGNOSIS — Z0000 Encounter for general adult medical examination without abnormal findings: Secondary | ICD-10-CM | POA: Diagnosis not present

## 2021-08-27 DIAGNOSIS — Z780 Asymptomatic menopausal state: Secondary | ICD-10-CM | POA: Diagnosis not present

## 2021-08-27 NOTE — Progress Notes (Signed)
Subjective:   LINET IWEN is a 70 y.o. female who presents for Medicare Annual (Subsequent) preventive examination.  Review of Systems    N/a       Objective:    There were no vitals filed for this visit. There is no height or weight on file to calculate BMI.  Advanced Directives 08/26/2020 11/07/2018 07/06/2018 10/23/2014  Does Patient Have a Medical Advance Directive? Yes No Yes Yes  Type of Paramedic of [place];Living will - [place];Living will;Out of facility DNR (pink MOST or yellow form) -  Does patient want to make changes to medical advance directive? No - Patient declined - No - Patient declined -  Copy of [place] in Chart? Yes - validated most recent copy scanned in chart (See row information) - No - copy requested -  Would patient like information on creating a medical advance directive? - No - Patient declined - -    Current Medications (verified) Outpatient Encounter Medications as of 08/27/2021  Medication Sig   amLODipine (NORVASC) 2.5 MG tablet Take 1 tablet (2.5 mg total) by mouth daily.   amoxicillin-clavulanate (AUGMENTIN) 875-125 MG tablet Take 1 tablet by mouth every 12 (twelve) hours.   azelastine (ASTELIN) 0.1 % nasal spray Place into both nostrils 2 (two) times daily. Use in each nostril as directed   Calcium Carb-Cholecalciferol (CALCIUM 600 + D PO) Take by mouth 2 (two) times daily.   clonazePAM (KLONOPIN) 0.5 MG tablet Take 1 tablet (0.5 mg total) by mouth 2 (two) times daily as needed for anxiety.   clonazePAM (KLONOPIN) 0.5 MG tablet clonazepam 0.5 mg tablet  Take 1 tablet twice a day by oral route as needed.   hydrocortisone 2.5 % cream APPLY TOPICALLY TWICE A DAY   methylphenidate (RITALIN) 10 MG tablet Take 10 mg by mouth as needed.   OnabotulinumtoxinA (BOTOX IJ) Inject as directed. Inject into eyelids every 90 days   Phenylephrine HCl (AFRIN ALLERGY NA) Place into the nose as  needed. Diluted with ocean nasal spray   sertraline (ZOLOFT) 100 MG tablet TAKE 1 TABLET BY MOUTH EVERY DAY   No facility-administered encounter medications on file as of 08/27/2021.    Allergies (verified) Patient has no known allergies.   History: Past Medical History:  Diagnosis Date   Benign essential blepharospasm    Hypertension    no meds   IBS (irritable bowel syndrome)    Migraines    UTI (urinary tract infection)    Past Surgical History:  Procedure Laterality Date   BLEPHAROPLASTY     EYE SURGERY  2014   SLK/ eyelid surgery/ Bil eyes   ovary removed     right side/ over 58 years   Family History  Problem Relation Age of Onset   Alzheimer's disease Mother    Heart failure Mother    Arthritis Mother    Depression Mother    Diabetes Mother    Hearing loss Father    Heart attack Father    Arthritis Sister    Hypertension Brother    Arthritis Maternal Grandmother    Social History   Socioeconomic History   Marital status: Married    Spouse name: John   Number of children: 0   Years of education: 15   Highest education level: Not on file  Occupational History   Occupation: semi retired    Comment: Oncologist  Tobacco Use   Smoking status: Former  Types: Cigarettes    Quit date: 05/23/2013    Years since quitting: 8.2   Smokeless tobacco: Never  Vaping Use   Vaping Use: Never used  Substance and Sexual Activity   Alcohol use: Yes    Alcohol/week: 7.0 standard drinks    Types: 7 Glasses of wine per week    Comment: week   Drug use: No   Sexual activity: Not on file  Other Topics Concern   Not on file  Social History Narrative   Patient consumes 2 cups of caffiene ,right handed   Social Determinants of Health   Financial Resource Strain: Not on file  Food Insecurity: Not on file  Transportation Needs: Not on file  Physical Activity: Not on file  Stress: Not on file  Social Connections: Not on file    Tobacco Counseling Counseling  given: Not Answered   Clinical Intake:                 Diabetic?no         Activities of Daily Living No flowsheet data found.  Patient Care Team: Billie Ruddy, MD as PCP - General (Family Medicine) Star Age, MD as Attending Physician (Neurology) Almedia Balls, MD as Consulting Physician (Orthopedic Surgery)  Indicate any recent Medical Services you may have received from other than [facility] providers in the past year (date may be approximate).     Assessment:   This is a routine wellness examination for Hedi.  Hearing/Vision screen No results found.  Dietary issues and exercise activities discussed:     Goals Addressed   None    Depression Screen PHQ 2/9 Scores 12/26/2020 08/26/2020  PHQ - 2 Score 2 1  PHQ- 9 Score 14 1    Fall Risk Fall Risk  08/26/2020  Falls in the past year? 0  Number falls in past yr: 0  Injury with Fall? 0  Risk for fall due to : Medication side effect  Follow up Falls evaluation completed;Falls prevention discussed    FALL RISK PREVENTION PERTAINING TO THE HOME:  Any stairs in or around the home? Yes  If so, are there any without handrails? No  Home free of loose throw rugs in walkways, pet beds, electrical cords, etc? Yes  Adequate lighting in your home to reduce risk of falls? Yes   ASSISTIVE DEVICES UTILIZED TO PREVENT FALLS:  Life alert? No  Use of a cane, walker or w/c? No  Grab bars in the bathroom? Yes  Shower chair or bench in shower? Yes  Elevated toilet seat or a handicapped toilet? No    Cognitive Function:    Normal cognitive status assessed by direct observation by this Nurse Health Advisor. No abnormalities found.      Immunizations Immunization History  Administered Date(s) Administered   Fluad Quad(high Dose 65+) 10/25/2019, 12/09/2020   Influenza, High Dose Seasonal PF 02/03/2018, 10/12/2018   PFIZER(Purple Top)SARS-COV-2 Vaccination 01/14/2020, 02/04/2020, 08/20/2020, 03/30/2021    Pneumococcal Conjugate-13 02/11/2016   Tdap 11/23/2020    TDAP status: Up to date  Flu Vaccine status: Up to date  Pneumococcal vaccine status: Up to date  Covid-19 vaccine status: Completed vaccines  Qualifies for Shingles Vaccine? Yes   Zostavax completed No   Shingrix Completed?: No.    Education has been provided regarding the importance of this vaccine. Patient has been advised to call insurance company to determine out of pocket expense if they have not yet received this vaccine. Advised may also receive vaccine at  local pharmacy or Health Dept. Verbalized acceptance and understanding.  Screening Tests Health Maintenance  Topic Date Due   Zoster Vaccines- Shingrix (1 of 2) Never done   DEXA SCAN  Never done   INFLUENZA VACCINE  07/20/2021   COVID-19 Vaccine (5 - Booster for Pfizer series) 07/30/2021   MAMMOGRAM  05/04/2022   TETANUS/TDAP  11/23/2030   Hepatitis C Screening  Completed   PNA vac Low Risk Adult  Completed   HPV VACCINES  Aged Out    Health Maintenance  Health Maintenance Due  Topic Date Due   Zoster Vaccines- Shingrix (1 of 2) Never done   DEXA SCAN  Never done   INFLUENZA VACCINE  07/20/2021   COVID-19 Vaccine (5 - Booster for [place] series) 07/30/2021    Colorectal cancer screening: Type of screening: Colonoscopy. Completed 10/13/2011. Repeat every 10 years  Mammogram status: Completed 04/24/2021. Repeat every year  Bone Density status: Ordered 08/27/2021. Pt provided with contact info and advised to call to schedule appt.  Lung Cancer Screening: (Low Dose CT Chest recommended if Age 71-80 years, 30 pack-year currently smoking OR have quit w/in 15years.) does not qualify.   Lung Cancer Screening Referral: n/a  Additional Screening:  Hepatitis C Screening: does not qualify; Completed 06/29/2021  Vision Screening: Recommended annual ophthalmology exams for early detection of glaucoma and other disorders of the eye. Is the patient up to date  with their annual eye exam?  Yes  Who is the provider or what is the name of the office in which the patient attends annual eye exams? Dr.Woods  If pt is not established with a provider, would they like to be referred to a provider to establish care? No .   Dental Screening: Recommended annual dental exams for proper oral hygiene  Community Resource Referral / Chronic Care Management: CRR required this visit?  No   CCM required this visit?  No      Plan:     I have personally reviewed and noted the following in the patient's chart:   Medical and social history Use of alcohol, tobacco or illicit drugs  Current medications and supplements including opioid prescriptions.  Functional ability and status Nutritional status Physical activity Advanced directives List of other physicians Hospitalizations, surgeries, and ER visits in previous 12 months Vitals Screenings to include cognitive, depression, and falls Referrals and appointments  In addition, I have reviewed and discussed with patient certain preventive protocols, quality metrics, and best practice recommendations. A written personalized care plan for preventive services as well as general preventive health recommendations were provided to patient.     Randel Pigg, LPN   [id number]   Nurse Notes: none

## 2021-08-27 NOTE — Patient Instructions (Signed)
Patricia Henry , Thank you for taking time to come for your Medicare Wellness Visit. I appreciate your ongoing commitment to your health goals. Please review the following plan we discussed and let me know if I can assist you in the future.   Screening recommendations/referrals: Colonoscopy: 10/13/2011 due 09/2021 Mammogram: 04/24/2021 Bone Density: ordered 08/27/2021 Recommended yearly ophthalmology/optometry visit for glaucoma screening and checkup Recommended yearly dental visit for hygiene and checkup  Vaccinations: Influenza vaccine: due in fall 2022  Pneumococcal vaccine: completed series  Tdap vaccine: 11/23/2020 Shingles vaccine: will consider     Advanced directives: will provide   Conditions/risks identified: none   Next appointment: none    Preventive Care 71 Years and Older, Female Preventive care refers to lifestyle choices and visits with your health care provider that can promote health and wellness. What does preventive care include? A yearly physical exam. This is also called an annual well check. Dental exams once or twice a year. Routine eye exams. Ask your health care provider how often you should have your eyes checked. Personal lifestyle choices, including: Daily care of your teeth and gums. Regular physical activity. Eating a healthy diet. Avoiding tobacco and drug use. Limiting alcohol use. Practicing safe sex. Taking low-dose aspirin every day. Taking vitamin and mineral supplements as recommended by your health care provider. What happens during an annual well check? The services and screenings done by your health care provider during your annual well check will depend on your age, overall health, lifestyle risk factors, and family history of disease. Counseling  Your health care provider may ask you questions about your: Alcohol use. Tobacco use. Drug use. Emotional well-being. Home and relationship well-being. Sexual activity. Eating  habits. History of falls. Memory and ability to understand (cognition). Work and work Statistician. Reproductive health. Screening  You may have the following tests or measurements: Height, weight, and BMI. Blood pressure. Lipid and cholesterol levels. These may be checked every 5 years, or more frequently if you are over 46 years old. Skin check. Lung cancer screening. You may have this screening every year starting at age 47 if you have a 30-pack-year history of smoking and currently smoke or have quit within the past 15 years. Fecal occult blood test (FOBT) of the stool. You may have this test every year starting at age 71. Flexible sigmoidoscopy or colonoscopy. You may have a sigmoidoscopy every 5 years or a colonoscopy every 10 years starting at age 71. Hepatitis C blood test. Hepatitis B blood test. Sexually transmitted disease (STD) testing. Diabetes screening. This is done by checking your blood sugar (glucose) after you have not eaten for a while (fasting). You may have this done every 1-3 years. Bone density scan. This is done to screen for osteoporosis. You may have this done starting at age 71. Mammogram. This may be done every 1-2 years. Talk to your health care provider about how often you should have regular mammograms. Talk with your health care provider about your test results, treatment options, and if necessary, the need for more tests. Vaccines  Your health care provider may recommend certain vaccines, such as: Influenza vaccine. This is recommended every year. Tetanus, diphtheria, and acellular pertussis (Tdap, Td) vaccine. You may need a Td booster every 10 years. Zoster vaccine. You may need this after age 42. Pneumococcal 13-valent conjugate (PCV13) vaccine. One dose is recommended after age 71. Pneumococcal polysaccharide (PPSV23) vaccine. One dose is recommended after age 71. Talk to your health care provider about which  screenings and vaccines you need and how  often you need them. This information is not intended to replace advice given to you by your health care provider. Make sure you discuss any questions you have with your health care provider. Document Released: 01/02/2016 Document Revised: 08/25/2016 Document Reviewed: 10/07/2015 Elsevier Interactive Patient Education  2017 [employer] Prevention in the Home Falls can cause injuries. They can happen to people of all ages. There are many things you can do to make your home safe and to help prevent falls. What can I do on the outside of my home? Regularly fix the edges of walkways and driveways and fix any cracks. Remove anything that might make you trip as you walk through a door, such as a raised step or threshold. Trim any bushes or trees on the path to your home. Use bright outdoor lighting. Clear any walking paths of anything that might make someone trip, such as rocks or tools. Regularly check to see if handrails are loose or broken. Make sure that both sides of any steps have handrails. Any raised decks and porches should have guardrails on the edges. Have any leaves, snow, or ice cleared regularly. Use sand or salt on walking paths during winter. Clean up any spills in your garage right away. This includes oil or grease spills. What can I do in the bathroom? Use night lights. Install grab bars by the toilet and in the tub and shower. Do not use towel bars as grab bars. Use non-skid mats or decals in the tub or shower. If you need to sit down in the shower, use a plastic, non-slip stool. Keep the floor dry. Clean up any water that spills on the floor as soon as it happens. Remove soap buildup in the tub or shower regularly. Attach bath mats securely with double-sided non-slip rug tape. Do not have throw rugs and other things on the floor that can make you trip. What can I do in the bedroom? Use night lights. Make sure that you have a light by your bed that is easy to  reach. Do not use any sheets or blankets that are too big for your bed. They should not hang down onto the floor. Have a firm chair that has side arms. You can use this for support while you get dressed. Do not have throw rugs and other things on the floor that can make you trip. What can I do in the kitchen? Clean up any spills right away. Avoid walking on wet floors. Keep items that you use a lot in easy-to-reach places. If you need to reach something above you, use a strong step stool that has a grab bar. Keep electrical cords out of the way. Do not use floor polish or wax that makes floors slippery. If you must use wax, use non-skid floor wax. Do not have throw rugs and other things on the floor that can make you trip. What can I do with my stairs? Do not leave any items on the stairs. Make sure that there are handrails on both sides of the stairs and use them. Fix handrails that are broken or loose. Make sure that handrails are as long as the stairways. Check any carpeting to make sure that it is firmly attached to the stairs. Fix any carpet that is loose or worn. Avoid having throw rugs at the top or bottom of the stairs. If you do have throw rugs, attach them to the floor with carpet  tape. Make sure that you have a light switch at the top of the stairs and the bottom of the stairs. If you do not have them, ask someone to add them for you. What else can I do to help prevent falls? Wear shoes that: Do not have high heels. Have rubber bottoms. Are comfortable and fit you well. Are closed at the toe. Do not wear sandals. If you use a stepladder: Make sure that it is fully opened. Do not climb a closed stepladder. Make sure that both sides of the stepladder are locked into place. Ask someone to hold it for you, if possible. Clearly mark and make sure that you can see: Any grab bars or handrails. First and last steps. Where the edge of each step is. Use tools that help you move  around (mobility aids) if they are needed. These include: Canes. Walkers. Scooters. Crutches. Turn on the lights when you go into a dark area. Replace any light bulbs as soon as they burn out. Set up your furniture so you have a clear path. Avoid moving your furniture around. If any of your floors are uneven, fix them. If there are any pets around you, be aware of where they are. Review your medicines with your doctor. Some medicines can make you feel dizzy. This can increase your chance of falling. Ask your doctor what other things that you can do to help prevent falls. This information is not intended to replace advice given to you by your health care provider. Make sure you discuss any questions you have with your health care provider. Document Released: 10/02/2009 Document Revised: 05/13/2016 Document Reviewed: 01/10/2015 Elsevier Interactive Patient Education  2017 [employer].

## 2021-09-21 DIAGNOSIS — G245 Blepharospasm: Secondary | ICD-10-CM | POA: Diagnosis not present

## 2021-09-22 DIAGNOSIS — Z23 Encounter for immunization: Secondary | ICD-10-CM | POA: Diagnosis not present

## 2021-10-05 DIAGNOSIS — G245 Blepharospasm: Secondary | ICD-10-CM | POA: Diagnosis not present

## 2021-10-13 DIAGNOSIS — Z23 Encounter for immunization: Secondary | ICD-10-CM | POA: Diagnosis not present

## 2021-10-15 ENCOUNTER — Encounter: Payer: Self-pay | Admitting: Family Medicine

## 2021-10-15 ENCOUNTER — Ambulatory Visit (INDEPENDENT_AMBULATORY_CARE_PROVIDER_SITE_OTHER): Payer: Medicare Other | Admitting: Family Medicine

## 2021-10-15 ENCOUNTER — Other Ambulatory Visit: Payer: Self-pay

## 2021-10-15 ENCOUNTER — Ambulatory Visit (INDEPENDENT_AMBULATORY_CARE_PROVIDER_SITE_OTHER): Payer: Medicare Other

## 2021-10-15 VITALS — BP 118/76 | HR 71 | Temp 98.4°F | Wt 161.0 lb

## 2021-10-15 DIAGNOSIS — R5383 Other fatigue: Secondary | ICD-10-CM

## 2021-10-15 DIAGNOSIS — R0602 Shortness of breath: Secondary | ICD-10-CM

## 2021-10-15 DIAGNOSIS — U099 Post covid-19 condition, unspecified: Secondary | ICD-10-CM | POA: Diagnosis not present

## 2021-10-15 LAB — CBC WITH DIFFERENTIAL/PLATELET
Basophils Absolute: 0 10*3/uL (ref 0.0–0.1)
Basophils Relative: 0.8 % (ref 0.0–3.0)
Eosinophils Absolute: 0.1 10*3/uL (ref 0.0–0.7)
Eosinophils Relative: 2.1 % (ref 0.0–5.0)
HCT: 41.5 % (ref 36.0–46.0)
Hemoglobin: 13.7 g/dL (ref 12.0–15.0)
Lymphocytes Relative: 25.3 % (ref 12.0–46.0)
Lymphs Abs: 1 10*3/uL (ref 0.7–4.0)
MCHC: 33 g/dL (ref 30.0–36.0)
MCV: 90.2 fl (ref 78.0–100.0)
Monocytes Absolute: 0.4 10*3/uL (ref 0.1–1.0)
Monocytes Relative: 11.5 % (ref 3.0–12.0)
Neutro Abs: 2.3 10*3/uL (ref 1.4–7.7)
Neutrophils Relative %: 60.3 % (ref 43.0–77.0)
Platelets: 158 10*3/uL (ref 150.0–400.0)
RBC: 4.6 Mil/uL (ref 3.87–5.11)
RDW: 13.4 % (ref 11.5–15.5)
WBC: 3.8 10*3/uL — ABNORMAL LOW (ref 4.0–10.5)

## 2021-10-15 LAB — BASIC METABOLIC PANEL
BUN: 20 mg/dL (ref 6–23)
CO2: 28 mEq/L (ref 19–32)
Calcium: 9.3 mg/dL (ref 8.4–10.5)
Chloride: 107 mEq/L (ref 96–112)
Creatinine, Ser: 0.79 mg/dL (ref 0.40–1.20)
GFR: 75.09 mL/min (ref >60.00)
Glucose, Bld: 71 mg/dL (ref 70–99)
Potassium: 4 mEq/L (ref 3.5–5.1)
Sodium: 142 mEq/L (ref 135–145)

## 2021-10-15 LAB — D-DIMER, QUANTITATIVE: D-Dimer, Quant: 0.4 mcg/mL FEU (ref <0.50)

## 2021-10-15 LAB — BRAIN NATRIURETIC PEPTIDE: Pro B Natriuretic peptide (BNP): 64 pg/mL (ref 0.0–100.0)

## 2021-10-15 NOTE — Progress Notes (Signed)
Subjective:    Patient ID: Patricia Henry, female    DOB: 1951-04-26, 70 y.o.   MRN: [id number]  Chief Complaint  Patient presents with   Shortness of Breath    Seems to be getting worse. Had it since covid.    HPI Patient was seen today for ongoing concern.  Pt with SOB with minimal activity and exertion.  Started 2 months ago.  Pt states other people notice that she seems to gasp for air intermittently.  Pt feels like she can't get a good breath in.  Able to lie flat at night, but notes recent increase in GERD symptoms which causes her sit up some.  GERD relieved with TUMS.  Denies CP, cough, dizziness, wheezing, LE edema, calf pain.  [place] was [place] at higher [place], [place].  Pt has a h/o persistent fatigue after COVID-19 infection May 2022.  Past Medical History:  Diagnosis Date   Benign essential blepharospasm    Hypertension    no meds   IBS (irritable bowel syndrome)    Migraines    UTI (urinary tract infection)     No Known Allergies  ROS General: Denies fever, chills, night sweats, changes in weight, changes in appetite HEENT: Denies headaches, ear pain, changes in vision, rhinorrhea, sore throat CV: Denies CP, palpitations, orthopnea +SOB Pulm: Denies cough, wheezing +SOB GI: Denies abdominal pain, nausea, vomiting, diarrhea, constipation GU: Denies dysuria, hematuria, frequency, vaginal discharge Msk: Denies muscle cramps, joint pains Neuro: Denies weakness, numbness, tingling Skin: Denies rashes, bruising Psych: Denies depression, anxiety, hallucinations     Objective:    Blood pressure 118/76, pulse 71, temperature 98.4 F (36.9 C), temperature source Oral, weight 161 lb (73 kg), SpO2 96 %.  Gen. Pleasant, well-nourished, in no distress, normal affect   HEENT: [place]/AT, face symmetric, conjunctiva clear, no scleral icterus, PERRLA, EOMI, nares patent without drainage Lungs: Intermittent pauses in conversation to take a deeper breath in, no accessory muscle use,  CTAB, no wheezes or rales. Cardiovascular: RRR, no m/r/g, no peripheral edema Musculoskeletal: No deformities, no edema, no cyanosis or clubbing, normal tone Neuro:  A&Ox3, CN II-XII intact, normal gait Skin:  Warm, no lesions/ rash   Wt Readings from Last 3 Encounters:  10/15/21 161 lb (73 kg)  06/29/21 163 lb 3.2 oz (74 kg)  03/05/21 162 lb 3.2 oz (73.6 kg)    Lab Results  Component Value Date   WBC 3.6 (L) 06/29/2021   HGB 13.5 06/29/2021   HCT 40.5 06/29/2021   PLT 165.0 06/29/2021   GLUCOSE 81 06/29/2021   ALT 19 06/29/2021   AST 17 06/29/2021   NA 141 06/29/2021   K 4.3 06/29/2021   CL 105 06/29/2021   CREATININE 0.73 06/29/2021   BUN 22 06/29/2021   CO2 29 06/29/2021   TSH 2.88 06/29/2021   HGBA1C 5.0 06/29/2021    Assessment/Plan:  SOB (shortness of breath)  -Discussed possible causes including reactive airway disease, new onset CHF, PE, viral etiology, anxiety, Pneumonia or other obstructive process. -6-minute walk test negative -Discussed obtaining imaging and labs -Further recommendations based on results -Given strict precautions for increased or worsening symptoms - Plan: DG Chest 2 View, CBC with Differential/Platelet, Basic metabolic panel, D-dimer, Quantitative, Brain Natriuretic Peptide  Persistent fatigue after COVID-19 -Continue vitamins and other supplements -Exercise encouraged  Essential hypertension -Controlled -Continue Norvasc 2.5 mg  F/u as needed  Grier Mitts, MD

## 2021-10-15 NOTE — Patient Instructions (Signed)
We will wait on the results from the labs and the imaging which will help determine how to proceed.  For any increased or worsening shortness of breath proceed to nearest emergency department.

## 2021-10-17 ENCOUNTER — Other Ambulatory Visit: Payer: Self-pay | Admitting: Family Medicine

## 2021-10-17 DIAGNOSIS — R5383 Other fatigue: Secondary | ICD-10-CM

## 2021-10-17 DIAGNOSIS — R0602 Shortness of breath: Secondary | ICD-10-CM

## 2021-10-27 ENCOUNTER — Ambulatory Visit (INDEPENDENT_AMBULATORY_CARE_PROVIDER_SITE_OTHER): Payer: Medicare Other | Admitting: Pulmonary Disease

## 2021-10-27 ENCOUNTER — Encounter: Payer: Self-pay | Admitting: Pulmonary Disease

## 2021-10-27 ENCOUNTER — Other Ambulatory Visit: Payer: Self-pay

## 2021-10-27 VITALS — BP 122/74 | HR 75 | Temp 97.8°F | Ht 67.0 in | Wt 162.0 lb

## 2021-10-27 DIAGNOSIS — R0609 Other forms of dyspnea: Secondary | ICD-10-CM | POA: Diagnosis not present

## 2021-10-27 NOTE — Patient Instructions (Signed)
Nice to meet you  We will get pulmonary function tests in the coming days to help evaluate your symptoms  Return to clinic in 2 months unless PFTs need to be scheduled in December in which case we will do the tests and see me to discuss results the same day.

## 2021-11-03 ENCOUNTER — Encounter: Payer: Self-pay | Admitting: Family Medicine

## 2021-11-04 ENCOUNTER — Ambulatory Visit
Admission: RE | Admit: 2021-11-04 | Discharge: 2021-11-04 | Disposition: A | Discharge status: Home / Self Care | Payer: Medicare Other | Source: Ambulatory Visit | Attending: Emergency Medicine | Admitting: Emergency Medicine

## 2021-11-04 ENCOUNTER — Other Ambulatory Visit: Payer: Self-pay

## 2021-11-04 VITALS — BP 122/70 | HR 78 | Temp 98.7°F | Resp 18

## 2021-11-04 DIAGNOSIS — F418 Other specified anxiety disorders: Secondary | ICD-10-CM | POA: Diagnosis not present

## 2021-11-04 DIAGNOSIS — R5383 Other fatigue: Secondary | ICD-10-CM

## 2021-11-04 DIAGNOSIS — Z833 Family history of diabetes mellitus: Secondary | ICD-10-CM

## 2021-11-04 DIAGNOSIS — D72819 Decreased white blood cell count, unspecified: Secondary | ICD-10-CM | POA: Diagnosis not present

## 2021-11-04 DIAGNOSIS — N951 Menopausal and female climacteric states: Secondary | ICD-10-CM

## 2021-11-04 DIAGNOSIS — R739 Hyperglycemia, unspecified: Secondary | ICD-10-CM | POA: Diagnosis not present

## 2021-11-04 LAB — POCT FASTING CBG KUC MANUAL ENTRY: POCT Glucose (KUC): 107 mg/dL — AB (ref 70–99)

## 2021-11-04 NOTE — ED Provider Notes (Signed)
[facility]    CSN: [id number] Arrival date & time: 11/04/21  0910   History   Chief Complaint No chief complaint on file.  HPI Patricia Henry is a 70 y.o. female. Pt reports for the last month she has been nauseated, exhausted, and extremely hungry at times.  Patient states that her brother was recently diagnosed with prediabetes and that both of her parents had diabetes.  Patient states she works very hard to eat a very healthy diet, states that in July when she received her routine lab results she was advised that her blood sugar was 124 and she became worried that she might have diabetes 2.  Patient states for breakfast this morning she had a horrible day, quinoa and spinach.  Patient states she is unsure how many carbohydrates she is consuming in a day but is very careful to avoid all products with empty carbs and sugar.  Patient also reports that she is noticed at nighttime she has been waking up in a sweat, patient states she is concerned that this is also related to her possibly having prediabetes.  Patient's nonfasting blood sugar in the office today was 101.  Vital signs are all stable.  The history is provided by the patient.   Past Medical History:  Diagnosis Date   Benign essential blepharospasm    Hypertension    no meds   IBS (irritable bowel syndrome)    Migraines    UTI (urinary tract infection)    Patient Active Problem List   Diagnosis Date Noted   Blepharospasm of both eyes 01/11/2021   Essential hypertension 01/11/2021   Adjustment disorder with other symptoms 07/04/2020   Past Surgical History:  Procedure Laterality Date   BLEPHAROPLASTY     EYE SURGERY  2014   SLK/ eyelid surgery/ Bil eyes   ovary removed     right side/ over 20 years   OB History     Gravida  1   Para      Term      Preterm      AB  1   Living  0      SAB      IAB  1   Ectopic      Multiple      Live Births  0          Home Medications    Prior to  Admission medications   Medication Sig Start Date End Date Taking? Authorizing Provider  azelastine (ASTELIN) 0.1 % nasal spray Place into both nostrils 2 (two) times daily. Use in each nostril as directed    [provider]  clonazePAM (KLONOPIN) 0.5 MG tablet Take 1 tablet (0.5 mg total) by mouth 2 (two) times daily as needed for anxiety. 04/15/20   Billie Ruddy, MD  clonazePAM (KLONOPIN) 0.5 MG tablet clonazepam 0.5 mg tablet  Take 1 tablet twice a day by oral route as needed.    [provider]  lisinopril (ZESTRIL) 5 MG tablet Take 5 mg by mouth daily.    [provider]  methylphenidate (RITALIN) 10 MG tablet Take 10 mg by mouth as needed.    [provider]  OnabotulinumtoxinA (BOTOX IJ) Inject as directed. Inject into eyelids every 90 days    [provider]  Phenylephrine HCl (AFRIN ALLERGY NA) Place into the nose as needed. Diluted with ocean nasal spray    [provider]  sertraline (ZOLOFT) 100 MG tablet TAKE 1 TABLET  BY MOUTH EVERY DAY 08/13/21   Billie Ruddy, MD   Family History Family History  Problem Relation Age of Onset   Alzheimer's disease Mother    Heart failure Mother    Arthritis Mother    Depression Mother    Diabetes Mother    Hearing loss Father    Heart attack Father    Arthritis Sister    Hypertension Brother    Arthritis Maternal Grandmother    Social History Social History   Tobacco Use   Smoking status: Former    Types: Cigarettes    Quit date: 05/23/2013    Years since quitting: 8.4   Smokeless tobacco: Never  Vaping Use   Vaping Use: Never used  Substance Use Topics   Alcohol use: Yes    Alcohol/week: 7.0 standard drinks    Types: 7 Glasses of wine per week    Comment: week   Drug use: No   Allergies   Patient has no known allergies.  Review of Systems Review of Systems Pertinent findings noted in history of present illness.   Physical Exam Triage Vital Signs ED Triage  Vitals  Enc Vitals Group     BP 10/16/21 0827 (!) 147/82     Pulse Rate 10/16/21 0827 72     Resp 10/16/21 0827 18     Temp 10/16/21 0827 98.3 F (36.8 C)     Temp Source 10/16/21 0827 Oral     SpO2 10/16/21 0827 98 %     Weight --      Height --      Head Circumference --      Peak Flow --      Pain Score 10/16/21 0826 5     Pain Loc --      Pain Edu? --      Excl. in [place]? --    No data found.  Updated Vital Signs BP 122/70 (BP Location: Left Arm)   Pulse 78   Temp 98.7 F (37.1 C) (Oral)   Resp 18   SpO2 96%   Visual Acuity Right Eye Distance:   Left Eye Distance:   Bilateral Distance:    Right Eye Near:   Left Eye Near:    Bilateral Near:     Physical Exam Vitals and nursing note reviewed.  Constitutional:      General: She is not in acute distress.    Appearance: Normal appearance. She is not ill-appearing.  HENT:     Head: Normocephalic and atraumatic.  Eyes:     General: Lids are normal.        Right eye: No discharge.        Left eye: No discharge.     Extraocular Movements: Extraocular movements intact.     Conjunctiva/sclera: Conjunctivae normal.     Right eye: Right conjunctiva is not injected.     Left eye: Left conjunctiva is not injected.  Neck:     Trachea: Trachea and phonation normal.  Cardiovascular:     Rate and Rhythm: Normal rate and regular rhythm.     Pulses: Normal pulses.     Heart sounds: Normal heart sounds. No murmur heard.   No friction rub. No gallop.  Pulmonary:     Effort: Pulmonary effort is normal. No accessory muscle usage, prolonged expiration or respiratory distress.     Breath sounds: Normal breath sounds. No stridor, decreased air movement or transmitted upper airway sounds. No decreased breath sounds, wheezing, rhonchi or rales.  Chest:     Chest wall: No tenderness.  Musculoskeletal:        General: Normal range of motion.     Cervical back: Normal range of motion and neck supple. Normal range of motion.   Lymphadenopathy:     Cervical: No cervical adenopathy.  Skin:    General: Skin is warm and dry.     Findings: No erythema or rash.  Neurological:     General: No focal deficit present.     Mental Status: She is alert and oriented to person, place, and time.  Psychiatric:        Mood and Affect: Mood normal.        Behavior: Behavior normal.   UC Treatments / Results  Labs (all labs ordered are listed, but only abnormal results are displayed)  Labs Reviewed  POCT FASTING CBG [facility] MANUAL ENTRY - Abnormal; Notable for the following components:      Result Value   POCT Glucose ([facility]) 107 (*)    All other components within normal limits  CBC WITH DIFFERENTIAL/PLATELET  HEMOGLOBIN A1C  TSH  T4, FREE  T3    EKG  Radiology No results found.  Procedures Procedures (including critical care time)  Medications Ordered in UC Medications - No data to display  Initial Impression / Assessment and Plan / UC Course  I have reviewed the triage vital signs and the nursing notes.  Pertinent labs & imaging results that were available during my care of the patient were reviewed by me and considered in my medical decision making (see chart for details).      Patient educated regarding the signs and symptoms of prediabetes as well as type 2 diabetes.  I have provided patient with a lot of information regarding diabetic lifestyle, DASH diet, menopause and health maintenance for menopausal women.  Patient mentions that she also sees a naturopath since she had COVID a month or so ago, states she has been diagnosed with long COVID and sees a naturopath to help her become "more healthy".  Patient states that her naturopath is not discussed hormone replacement therapy with her nor has her primary care provider.  Patient states she did not have any difficulties in the years before or after menopause and was never treated for any symptoms.  Per EMR, I see that patient has a history of being prescribed  clonazepam and sertraline, PDMP reviewed, clonazepam has not been renewed since August but I see in the EMR that her sertraline is current.  I suspect patient has a significant amount anxiety about her health particularly post COVID and having found out from her brother that he is diabetic.  I reassured patient that none of the symptoms she is exhibiting right now could be in any way related to diabetes given her history of low blood sugar and nonexistent elevated A1c.  Advised patient to make sure she is eating enough carbohydrates every day as her for symptoms of fatigue could be related to malnourishment.  Also advised her to speak with her naturopath about possible natural hormone replacement treatment to reduce her symptoms of hot flashes and that hot flashes can appear for as many as 20 years after menopause and are not necessarily related to the act of menstruation itself but more related to significant drops in estrogen.  I performed an A1c today for her reassurance and also repeated her thyroid testing as I noticed her T4 level was at the bottom of normal when last checked.  I will forward the results of these labs to her primary care provider.  Final Clinical Impressions(s) / UC Diagnoses   Final diagnoses:  Other fatigue  Concerned about health  Leukopenia, unspecified type  Family history of type 2 diabetes mellitus in mother  Family history of type 2 diabetes mellitus in father  Hyperglycemia  Hot flashes due to menopause     Discharge Instructions      You have excellent blood pressure, your body mass index of 25.4 is just at the top of your range between 18 and 25 and keep in mind that you did your breakfast this morning.  For your fatigue, I have repeated your A1c and your complete blood count.  I also took the liberty of checking your thyroid function again, I noticed that while you are T4 was in normal range but it was at the lowest end of normal.    I have also provided you  with some information regarding the DASH diet, I am encouraging you to shoot for a goal of carbohydrate intake between 160 and 240 mg/day.  I recommend that you speak with your provider about DEXA bone density scanning if you have not had this done in the past 2 to 3 years.  I also recommend that you speak with your naturopath about hormone testing and regulation.  I provided you further information about prediabetes also known as insulin resistance for you to read to you can better understand what is going on with your brother.  Finally, I provided you some information regarding hot flashes and menopausal symptoms.     ED Prescriptions   None    PDMP not reviewed this encounter.  Disposition Upon Discharge:  Patient presented with an acute illness with associated systemic symptoms and significant discomfort requiring urgent management. In my opinion, this is a condition that a prudent lay person (someone who possesses an average knowledge of health and medicine) may potentially expect to result in complications if not addressed urgently such as respiratory distress, impairment of bodily function or dysfunction of bodily organs.   Routine symptom specific, illness specific and/or disease specific instructions were discussed with the patient and/or caregiver at length.   As such, the patient has been evaluated and assessed, work-up was performed and treatment was provided in alignment with urgent care protocols and evidence based medicine.  Patient/parent/caregiver has been advised that the patient may require follow up for further testing and treatment if the symptoms continue in spite of treatment, as clinically indicated and appropriate.  Patient/parent/caregiver has been advised to return to the [facility] or PCP in 3-5 days if no better; to PCP or the Emergency Department if new signs and symptoms develop, or if the current signs or symptoms continue to change or worsen for further workup,  evaluation and treatment as clinically indicated and appropriate  The patient will follow up with their current PCP if and as advised. If the patient does not currently have a PCP we will assist them in obtaining one.   The patient may need specialty follow up if the symptoms continue, in spite of conservative treatment and management, for further workup, evaluation, consultation and treatment as clinically indicated and appropriate.  Patient/parent/caregiver verbalized understanding and agreement of plan as discussed.  All questions were addressed during visit.  Please see discharge instructions below for further details of plan.  Condition: stable for discharge home Home: take medications as prescribed; routine discharge instructions as discussed; follow up as advised.  Lynden Oxford Scales, PA-C 11/04/21 1525

## 2021-11-04 NOTE — Discharge Instructions (Addendum)
You have excellent blood pressure, your body mass index of 25.4 is just at the top of your range between 18 and 25 and keep in mind that you did your breakfast this morning.  For your fatigue, I have repeated your A1c and your complete blood count.  I also took the liberty of checking your thyroid function again, I noticed that while you are T4 was in normal range but it was at the lowest end of normal.    I have also provided you with some information regarding the DASH diet, I am encouraging you to shoot for a goal of carbohydrate intake between 160 and 240 mg/day.  I recommend that you speak with your provider about DEXA bone density scanning if you have not had this done in the past 2 to 3 years.  I also recommend that you speak with your naturopath about hormone testing and regulation.  I provided you further information about prediabetes also known as insulin resistance for you to read to you can better understand what is going on with your brother.  Finally, I provided you some information regarding hot flashes and menopausal symptoms.

## 2021-11-04 NOTE — ED Triage Notes (Signed)
Pt reports for the last month she has been nauseated, exhausted, and extremely hungry at times.  Pt states she is wondering if she is prediabetic.

## 2021-11-05 LAB — CBC WITH DIFFERENTIAL/PLATELET
Basophils Absolute: 0 10*3/uL (ref 0.0–0.2)
Basos: 1 %
EOS (ABSOLUTE): 0.1 10*3/uL (ref 0.0–0.4)
Eos: 2 %
Hematocrit: 40.6 % (ref 34.0–46.6)
Hemoglobin: 13.5 g/dL (ref 11.1–15.9)
Immature Grans (Abs): 0 10*3/uL (ref 0.0–0.1)
Immature Granulocytes: 0 %
Lymphocytes Absolute: 1 10*3/uL (ref 0.7–3.1)
Lymphs: 23 %
MCH: 30.1 pg (ref 26.6–33.0)
MCHC: 33.3 g/dL (ref 31.5–35.7)
MCV: 91 fL (ref 79–97)
Monocytes Absolute: 0.4 10*3/uL (ref 0.1–0.9)
Monocytes: 9 %
Neutrophils Absolute: 3 10*3/uL (ref 1.4–7.0)
Neutrophils: 65 %
Platelets: 177 10*3/uL (ref 150–450)
RBC: 4.48 x10E6/uL (ref 3.77–5.28)
RDW: 13.1 % (ref 11.7–15.4)
WBC: 4.5 10*3/uL (ref 3.4–10.8)

## 2021-11-05 LAB — T3: T3, Total: 65 ng/dL — ABNORMAL LOW (ref 71–180)

## 2021-11-05 LAB — T4, FREE: Free T4: 1.16 ng/dL (ref 0.82–1.77)

## 2021-11-05 LAB — TSH: TSH: 3.12 u[IU]/mL (ref 0.450–4.500)

## 2021-11-05 LAB — HEMOGLOBIN A1C
Est. average glucose Bld gHb Est-mCnc: 103 mg/dL
Hgb A1c MFr Bld: 5.2 % (ref 4.8–5.6)

## 2021-11-14 ENCOUNTER — Other Ambulatory Visit: Payer: Self-pay | Admitting: Family Medicine

## 2021-11-14 DIAGNOSIS — F4329 Adjustment disorder with other symptoms: Secondary | ICD-10-CM

## 2021-12-04 LAB — LIPID PANEL
HDL: 90 — AB (ref 35–70)
LDL Cholesterol: 215
Triglycerides: 77 (ref 40–160)

## 2021-12-05 ENCOUNTER — Encounter: Payer: Self-pay | Admitting: Family Medicine

## 2021-12-05 ENCOUNTER — Encounter: Payer: Self-pay | Admitting: Pulmonary Disease

## 2021-12-05 DIAGNOSIS — E2839 Other primary ovarian failure: Secondary | ICD-10-CM

## 2021-12-07 NOTE — Telephone Encounter (Signed)
Dr. Silas Flood please review following information provided by pt via My Chart.   Please add this to my file  Attachments  Cholesterol Test [email or web address]  Thank you

## 2021-12-07 NOTE — Telephone Encounter (Signed)
Results printed and sent to scan. Nothing further needed at this time.

## 2021-12-07 NOTE — Telephone Encounter (Signed)
Thanks - please print and scan to Media tab.

## 2021-12-16 ENCOUNTER — Other Ambulatory Visit: Payer: Self-pay

## 2021-12-16 ENCOUNTER — Encounter: Payer: Self-pay | Admitting: Pulmonary Disease

## 2021-12-16 ENCOUNTER — Ambulatory Visit (INDEPENDENT_AMBULATORY_CARE_PROVIDER_SITE_OTHER): Payer: Medicare Other | Admitting: Pulmonary Disease

## 2021-12-16 VITALS — BP 120/74 | HR 68 | Temp 98.0°F | Ht 67.0 in | Wt 161.0 lb

## 2021-12-16 DIAGNOSIS — J4530 Mild persistent asthma, uncomplicated: Secondary | ICD-10-CM | POA: Diagnosis not present

## 2021-12-16 DIAGNOSIS — R0609 Other forms of dyspnea: Secondary | ICD-10-CM | POA: Diagnosis not present

## 2021-12-16 LAB — PULMONARY FUNCTION TEST
DL/VA % pred: 92 %
DL/VA: 3.74 ml/min/mmHg/L
DLCO cor % pred: 85 %
DLCO cor: 18.36 ml/min/mmHg
DLCO unc % pred: 85 %
DLCO unc: 18.41 ml/min/mmHg
FEF 25-75 Post: 2.35 L/sec
FEF 25-75 Pre: 1.35 L/sec
FEF2575-%Change-Post: 73 %
FEF2575-%Pred-Post: 115 %
FEF2575-%Pred-Pre: 66 %
FEV1-%Change-Post: 15 %
FEV1-%Pred-Post: 87 %
FEV1-%Pred-Pre: 75 %
FEV1-Post: 2.2 L
FEV1-Pre: 1.9 L
FEV1FVC-%Change-Post: 4 %
FEV1FVC-%Pred-Pre: 96 %
FEV6-%Change-Post: 10 %
FEV6-%Pred-Post: 89 %
FEV6-%Pred-Pre: 81 %
FEV6-Post: 2.87 L
FEV6-Pre: 2.59 L
FEV6FVC-%Change-Post: 0 %
FEV6FVC-%Pred-Post: 104 %
FEV6FVC-%Pred-Pre: 104 %
FVC-%Change-Post: 10 %
FVC-%Pred-Post: 86 %
FVC-%Pred-Pre: 77 %
FVC-Post: 2.88 L
FVC-Pre: 2.6 L
Post FEV1/FVC ratio: 77 %
Post FEV6/FVC ratio: 100 %
Pre FEV1/FVC ratio: 73 %
Pre FEV6/FVC Ratio: 100 %
RV % pred: 111 %
RV: 2.63 L
TLC % pred: 97 %
TLC: 5.38 L

## 2021-12-16 MED ORDER — ALBUTEROL SULFATE HFA 108 (90 BASE) MCG/ACT IN AERS: 2.0000 | INHALATION_SPRAY | Freq: Four times a day (QID) | RESPIRATORY_TRACT | 6 refills | Status: DC | PRN

## 2021-12-16 MED ORDER — FLUTICASONE FUROATE-VILANTEROL 200-25 MCG/ACT IN AEPB: 1.0000 | INHALATION_SPRAY | Freq: Every day | RESPIRATORY_TRACT | 3 refills | Status: DC

## 2021-12-16 NOTE — Progress Notes (Signed)
PFT done today. 

## 2021-12-16 NOTE — Patient Instructions (Addendum)
Nice to see you again  Your breathing test overall are quite normal but consistent with a diagnosis of asthma given improvement in the numbers after receiving albuterol.  I recommend using a daily inhaler to help improve your symptoms.  Use Breo 1 puff in the morning.  Rinse your mouth out after every use.  If the co-pay is too high, please ask the pharmacist what the preferred agent in this class of medication would be.  Please report back to me and I can prescribe this.  If they are not very helpful, please let our office know and we can look into alternatives as well.  I prescribed albuterol 2 puffs every 6 hours as needed for shortness of breath.  It will take 15 to 30 minutes for this to be at effective but please use it if you feel like you are having symptoms or not improving or worsening at times.  Return to clinic in 3 months or sooner as needed with Dr. Silas Flood

## 2021-12-16 NOTE — Progress Notes (Signed)
@Patient  ID: Patricia Henry, female    DOB: 11-12-1951, 70 y.o.   MRN: [id number]  Chief Complaint  Patient presents with   Follow-up    Here for a follow up for PFT results. Pt states that when she does exert herself she still gets some SHOB noted. But other then that she states she is fine     Referring provider: Billie Ruddy, MD  HPI:   70 y.o. whom we are seeing in consultation for evaluation of dyspnea on exertion.  PFTs performed today reviewed.  Returns to clinic for scheduled follow-up.  Still dyspneic when going up stairs, hills, exercising.  Overall otherwise doing okay.  PFTs performed today.  Reviewed at length.  Spirometry normal with significant bronchodilator response with 300 cc or 15% improvement in FEV1 after albuterol.  Lung volumes normal, DLCO normal.  Discussed at length likely diagnosis of asthma.  Triggered by viral infection, COVID in May 2022.  HPI at initial visit: Patient was in normal state of health.  Developed COVID May 2022.  Initial symptoms improved.  Had lingering dyspnea on exertion.  Worse with stairs and inclines.  Persistence of lingered for several months.  Gradual improvement but still present.  No time of day to things are better or worse.  No position makes it better or worse.  No seasonal environmental factors he can identify that make things better or worse.  No other relieving or exacerbating factors.  Recently seen by PCP with chest x-ray 10/15/2021 on my review and interpretation with clear lungs bilaterally.  PMH: Anxiety Surgical history: Eye surgery, oophorectomy Family history: Mother with Alzheimer's, heart failure, father with CAD Social history: Former smoker, quit 2014, lives in [place] / Pulmonary Flowsheets:   ACT:  No flowsheet data found.  MMRC: mMRC Dyspnea Scale mMRC Score  12/16/2021 1    Epworth:  No flowsheet data found.  Tests:   FENO:  No results found for: NITRICOXIDE  PFT: PFT  Results Latest Ref Rng & Units 12/16/2021  FVC-Pre L 2.60  FVC-Predicted Pre % 77  FVC-Post L 2.88  FVC-Predicted Post % 86  Pre FEV1/FVC % % 73  Post FEV1/FCV % % 77  FEV1-Pre L 1.90  FEV1-Predicted Pre % 75  FEV1-Post L 2.20  DLCO uncorrected ml/min/mmHg 18.41  DLCO UNC% % 85  DLCO corrected ml/min/mmHg 18.36  DLCO COR %Predicted % 85  DLVA Predicted % 92  TLC L 5.38  TLC % Predicted % 97  RV % Predicted % 111  Personally reviewed interpreted as: Spirometry normal with significant bronchodilator response with 300 cc or 15% improvement in FEV1 after albuterol.  Lung volumes normal, DLCO normal.   WALK:  No flowsheet data found.  Imaging: Personally reviewed as per EMR discussion this note  Lab Results: Personally reviewed CBC    Component Value Date/Time   WBC 4.5 11/04/2021 1024   WBC 3.8 (L) 10/15/2021 0911   RBC 4.48 11/04/2021 1024   RBC 4.60 10/15/2021 0911   HGB 13.5 11/04/2021 1024   HCT 40.6 11/04/2021 1024   PLT 177 11/04/2021 1024   MCV 91 11/04/2021 1024   MCH 30.1 11/04/2021 1024   MCH 30.2 10/01/2020 1248   MCHC 33.3 11/04/2021 1024   MCHC 33.0 10/15/2021 0911   RDW 13.1 11/04/2021 1024   LYMPHSABS 1.0 11/04/2021 1024   MONOABS 0.4 10/15/2021 0911   EOSABS 0.1 11/04/2021 1024   BASOSABS 0.0 11/04/2021 1024    BMET  Component Value Date/Time   NA 142 10/15/2021 0911   K 4.0 10/15/2021 0911   CL 107 10/15/2021 0911   CO2 28 10/15/2021 0911   GLUCOSE 71 10/15/2021 0911   BUN 20 10/15/2021 0911   CREATININE 0.79 10/15/2021 0911   CREATININE 0.83 10/01/2020 1248   CALCIUM 9.3 10/15/2021 0911   GFRNONAA 71 10/01/2020 1248   GFRAA 83 10/01/2020 1248    BNP No results found for: BNP  ProBNP    Component Value Date/Time   PROBNP 64.0 10/15/2021 0911    Specialty Problems   None   No Known Allergies  Immunization History  Administered Date(s) Administered   Fluad Quad(high Dose 65+) 10/25/2019, 12/09/2020   Influenza, High Dose  Seasonal PF 02/03/2018, 10/12/2018   Influenza-Unspecified 09/22/2021   PFIZER(Purple Top)SARS-COV-2 Vaccination 01/14/2020, 02/04/2020, 08/20/2020, 03/30/2021   Pfizer Covid-19 Vaccine Bivalent Booster 88yrs & up 10/13/2021   Pneumococcal Conjugate-13 02/11/2016   Pneumococcal Polysaccharide-23 08/20/2020   Tdap 11/23/2020    Past Medical History:  Diagnosis Date   Benign essential blepharospasm    Hypertension    no meds   IBS (irritable bowel syndrome)    Migraines    UTI (urinary tract infection)     Tobacco History: Social History   Tobacco Use  Smoking Status Former   Types: Cigarettes   Quit date: 05/23/2013   Years since quitting: 8.5  Smokeless Tobacco Never   Counseling given: Not Answered   Continue to not smoke  Outpatient Encounter Medications as of 12/16/2021  Medication Sig   albuterol (VENTOLIN HFA) 108 (90 Base) MCG/ACT inhaler Inhale 2 puffs into the lungs every 6 (six) hours as needed for wheezing or shortness of breath.   azelastine (ASTELIN) 0.1 % nasal spray Place into both nostrils 2 (two) times daily. Use in each nostril as directed   calcium carbonate (OS-CAL) 1250 (500 Ca) MG chewable tablet Chew 1 tablet by mouth 3 (three) times daily.   CETIRIZINE HCL PO Take by mouth.   clonazePAM (KLONOPIN) 0.5 MG tablet Take 1 tablet (0.5 mg total) by mouth 2 (two) times daily as needed for anxiety.   clonazePAM (KLONOPIN) 0.5 MG tablet clonazepam 0.5 mg tablet  Take 1 tablet twice a day by oral route as needed.   co-enzyme Q-10 30 MG capsule Take 30 mg by mouth 3 (three) times daily.   fluticasone furoate-vilanterol (BREO ELLIPTA) 200-25 MCG/ACT AEPB Inhale 1 puff into the lungs daily.   lisinopril (ZESTRIL) 5 MG tablet Take 5 mg by mouth daily.   loperamide (IMODIUM) 2 MG capsule Take by mouth as needed for diarrhea or loose stools.   Melatonin 5 MG CAPS Take 5 mg by mouth daily.   methylphenidate (RITALIN) 10 MG tablet Take 10 mg by mouth as needed.    OnabotulinumtoxinA (BOTOX IJ) Inject as directed. Inject into eyelids every 90 days   Phenylephrine HCl (AFRIN ALLERGY NA) Place into the nose as needed. Diluted with ocean nasal spray   sertraline (ZOLOFT) 100 MG tablet TAKE 1 TABLET BY MOUTH EVERY DAY   thiamine (VITAMIN B-1) 100 MG tablet Take 100 mg by mouth daily.   No facility-administered encounter medications on file as of 12/16/2021.     Review of Systems  Review of Systems  N/a  Physical Exam  BP 120/74 (BP Location: Left Arm, Patient Position: Sitting, Cuff Size: Normal)    Pulse 68    Temp 98 F (36.7 C) (Oral)    Ht 5\' 7"  (1.702 m)  Wt 161 lb (73 kg)    SpO2 96%    BMI 25.22 kg/m   Wt Readings from Last 5 Encounters:  12/16/21 161 lb (73 kg)  10/27/21 162 lb (73.5 kg)  10/15/21 161 lb (73 kg)  06/29/21 163 lb 3.2 oz (74 kg)  03/05/21 162 lb 3.2 oz (73.6 kg)    BMI Readings from Last 5 Encounters:  12/16/21 25.22 kg/m  10/27/21 25.37 kg/m  10/15/21 25.22 kg/m  06/29/21 25.56 kg/m  03/05/21 25.40 kg/m     Physical Exam General: Well-appearing, no distress Eyes: EOMI, icterus Neck: Supple, no JVP Cardiovascular: Regular rate and rhythm, no murmur Pulmonary: Clear, no work of breathing MSK: No synovitis, no joint effusion Neuro: Normal gait, no weakness Psych: Normal mood, full affect  Assessment & Plan:   DOE: suspect post viral process given timing after covid May 2022.  Chest imaging clear.  Gradual improvement but still residual symptoms with exertion. Element of deconditioning likely.  PFTs 11/2021 consistent with significant bronchodilator spots.  Asthma: Based on bronchial response on PFTs 11/2021.  Also clinically with persistent dyspnea on exertion following viral, COVID illness 04/2021.  Breo prescribed today.  Albuterol as needed prescribed today.  Return in about 3 months (around 03/16/2022).   Lanier Clam, MD 12/16/2021

## 2021-12-16 NOTE — Progress Notes (Signed)
@Patient  ID: Patricia Henry, female    DOB: Aug 13, 1951, 70 y.o.   MRN: [id number]  Chief Complaint  Patient presents with   Consult    Patient reports that she has shortness of breath is worse with exertion. She had covid in may.     Referring provider: Billie Ruddy, MD  HPI:   70 y.o. whom we are seeing in consultation for evaluation of dyspnea on exertion.  Note from referring provider reviewed.  Patient was in normal state of health.  Developed COVID May 2022.  Initial symptoms improved.  Had lingering dyspnea on exertion.  Worse with stairs and inclines.  Persistence of lingered for several months.  Gradual improvement but still present.  No time of day to things are better or worse.  No position makes it better or worse.  No seasonal environmental factors he can identify that make things better or worse.  No other relieving or exacerbating factors.  Recently seen by PCP with chest x-ray 10/15/2021 on my review and interpretation with clear lungs bilaterally.  PMH: Anxiety Surgical history: Eye surgery, oophorectomy Family history: Mother with Alzheimer's, heart failure, father with CAD Social history: Former smoker, quit 2014, lives in [place] / Pulmonary Flowsheets:   ACT:  No flowsheet data found.  MMRC: No flowsheet data found.  Epworth:  No flowsheet data found.  Tests:   FENO:  No results found for: NITRICOXIDE  PFT: No flowsheet data found.  WALK:  No flowsheet data found.  Imaging: No results found.  Lab Results:  CBC    Component Value Date/Time   WBC 4.5 11/04/2021 1024   WBC 3.8 (L) 10/15/2021 0911   RBC 4.48 11/04/2021 1024   RBC 4.60 10/15/2021 0911   HGB 13.5 11/04/2021 1024   HCT 40.6 11/04/2021 1024   PLT 177 11/04/2021 1024   MCV 91 11/04/2021 1024   MCH 30.1 11/04/2021 1024   MCH 30.2 10/01/2020 1248   MCHC 33.3 11/04/2021 1024   MCHC 33.0 10/15/2021 0911   RDW 13.1 11/04/2021 1024   LYMPHSABS 1.0  11/04/2021 1024   MONOABS 0.4 10/15/2021 0911   EOSABS 0.1 11/04/2021 1024   BASOSABS 0.0 11/04/2021 1024    BMET    Component Value Date/Time   NA 142 10/15/2021 0911   K 4.0 10/15/2021 0911   CL 107 10/15/2021 0911   CO2 28 10/15/2021 0911   GLUCOSE 71 10/15/2021 0911   BUN 20 10/15/2021 0911   CREATININE 0.79 10/15/2021 0911   CREATININE 0.83 10/01/2020 1248   CALCIUM 9.3 10/15/2021 0911   GFRNONAA 71 10/01/2020 1248   GFRAA 83 10/01/2020 1248    BNP No results found for: BNP  ProBNP    Component Value Date/Time   PROBNP 64.0 10/15/2021 0911    Specialty Problems   None   No Known Allergies  Immunization History  Administered Date(s) Administered   Fluad Quad(high Dose 65+) 10/25/2019, 12/09/2020   Influenza, High Dose Seasonal PF 02/03/2018, 10/12/2018   Influenza-Unspecified 09/22/2021   PFIZER(Purple Top)SARS-COV-2 Vaccination 01/14/2020, 02/04/2020, 08/20/2020, 03/30/2021   Pfizer Covid-19 Vaccine Bivalent Booster 109yrs & up 10/13/2021   Pneumococcal Conjugate-13 02/11/2016   Pneumococcal Polysaccharide-23 08/20/2020   Tdap 11/23/2020    Past Medical History:  Diagnosis Date   Benign essential blepharospasm    Hypertension    no meds   IBS (irritable bowel syndrome)    Migraines    UTI (urinary tract infection)     Tobacco  History: Social History   Tobacco Use  Smoking Status Former   Types: Cigarettes   Quit date: 05/23/2013   Years since quitting: 8.5  Smokeless Tobacco Never   Counseling given: Not Answered   Continue to not smoke  Outpatient Encounter Medications as of 10/27/2021  Medication Sig   azelastine (ASTELIN) 0.1 % nasal spray Place into both nostrils 2 (two) times daily. Use in each nostril as directed   clonazePAM (KLONOPIN) 0.5 MG tablet Take 1 tablet (0.5 mg total) by mouth 2 (two) times daily as needed for anxiety.   clonazePAM (KLONOPIN) 0.5 MG tablet clonazepam 0.5 mg tablet  Take 1 tablet twice a day by oral route  as needed.   lisinopril (ZESTRIL) 5 MG tablet Take 5 mg by mouth daily.   methylphenidate (RITALIN) 10 MG tablet Take 10 mg by mouth as needed.   OnabotulinumtoxinA (BOTOX IJ) Inject as directed. Inject into eyelids every 90 days   Phenylephrine HCl (AFRIN ALLERGY NA) Place into the nose as needed. Diluted with ocean nasal spray   [DISCONTINUED] sertraline (ZOLOFT) 100 MG tablet TAKE 1 TABLET BY MOUTH EVERY DAY   [DISCONTINUED] amLODipine (NORVASC) 2.5 MG tablet Take 1 tablet (2.5 mg total) by mouth daily. (Patient not taking: No sig reported)   [DISCONTINUED] amoxicillin-clavulanate (AUGMENTIN) 875-125 MG tablet Take 1 tablet by mouth every 12 (twelve) hours. (Patient not taking: No sig reported)   [DISCONTINUED] Calcium Carb-Cholecalciferol (CALCIUM 600 + D PO) Take by mouth 2 (two) times daily.   [DISCONTINUED] hydrocortisone 2.5 % cream APPLY TOPICALLY TWICE A DAY   No facility-administered encounter medications on file as of 10/27/2021.     Review of Systems  Review of Systems  No chest pain with exertion.  No orthopnea or PND.  Comprehensive review of systems otherwise negative. Physical Exam  BP 122/74 (BP Location: Left Arm, Patient Position: Sitting, Cuff Size: Normal)    Pulse 75    Temp 97.8 F (36.6 C) (Oral)    Ht 5\' 7"  (1.702 m)    Wt 162 lb (73.5 kg)    SpO2 97%    BMI 25.37 kg/m   Wt Readings from Last 5 Encounters:  10/27/21 162 lb (73.5 kg)  10/15/21 161 lb (73 kg)  06/29/21 163 lb 3.2 oz (74 kg)  03/05/21 162 lb 3.2 oz (73.6 kg)  02/20/21 162 lb 9.6 oz (73.8 kg)    BMI Readings from Last 5 Encounters:  10/27/21 25.37 kg/m  10/15/21 25.22 kg/m  06/29/21 25.56 kg/m  03/05/21 25.40 kg/m  02/20/21 25.47 kg/m     Physical Exam General: Well-appearing, no distress Eyes: EOMI, icterus Neck: Supple, no JVP Cardiovascular: Regular rate and rhythm, no murmur Pulmonary: Clear, no work of breathing Abdomen: Nondistended, bowel sounds present MSK: No  synovitis, no joint effusion Neuro: Normal gait, no weakness Psych: Normal mood, full affect  Assessment & Plan:   DOE: suspect post viral process given timing after covid May 2022.  Chest imaging clear.  Gradual improvement but still residual symptoms. Element of deconditioning likely.  Expect to see gradual improvement continue.  Will pursue PFT for further evaluation.  Consider ICS/LABA therapy in the future if not continue to improve.   Return in about 2 months (around 12/27/2021).   Lanier Clam, MD 12/16/2021

## 2022-01-04 ENCOUNTER — Encounter: Payer: Self-pay | Admitting: Family Medicine

## 2022-01-06 ENCOUNTER — Encounter: Payer: Self-pay | Admitting: Family Medicine

## 2022-01-06 ENCOUNTER — Telehealth (INDEPENDENT_AMBULATORY_CARE_PROVIDER_SITE_OTHER): Payer: Medicare Other | Admitting: Family Medicine

## 2022-01-06 DIAGNOSIS — R0609 Other forms of dyspnea: Secondary | ICD-10-CM

## 2022-01-06 DIAGNOSIS — R4189 Other symptoms and signs involving cognitive functions and awareness: Secondary | ICD-10-CM

## 2022-01-06 DIAGNOSIS — J4530 Mild persistent asthma, uncomplicated: Secondary | ICD-10-CM | POA: Diagnosis not present

## 2022-01-06 DIAGNOSIS — F4329 Adjustment disorder with other symptoms: Secondary | ICD-10-CM | POA: Diagnosis not present

## 2022-01-06 DIAGNOSIS — R002 Palpitations: Secondary | ICD-10-CM

## 2022-01-06 DIAGNOSIS — G245 Blepharospasm: Secondary | ICD-10-CM | POA: Diagnosis not present

## 2022-01-06 MED ORDER — SERTRALINE HCL 25 MG PO TABS: 75.0000 mg | ORAL_TABLET | Freq: Every day | ORAL | 3 refills | Status: DC

## 2022-01-06 NOTE — Progress Notes (Signed)
Virtual Visit via Video Note  I connected with Sheba Whaling on 01/06/22 at  4:00 PM EST by a video enabled telemedicine application 2/2 [id number] pandemic and verified that I am speaking with the correct person using two identifiers.  Location patient: home Location provider:work or home office Persons participating in the virtual visit: patient, provider  I discussed the limitations of evaluation and management by telemedicine and the availability of in person appointments. The patient expressed understanding and agreed to proceed. Chief Complaint  Patient presents with   Medication Problem    Wants to wean off of a medication     HPI:  Pt has been feeling well.  Wants to take less medication.  Interested in weaning off Zoloft 100 mg.  Has not had any episodes of crying in a while.  SOB has increased.  Saw pulmonology, breathing test that showed improvement with albuterol.  Pt taking Breo, but hasn't noticed any difference.  Dyspnea with going up stairs, walking on an incline, or with increased conversation.  Pt getting Botox for blepharospasm.  Due for another injection and notes increased spasms in the 30 days prior to the injection.  This causes increased anxiety which makes her breathing worse.  HAs rx for ritalin, but takes it if has to drive 2/2 blephrospasms.  Has no trouble laying flat at night.  Denies LE edema, chest tightness, chest pain, allergies, anything that improves symptoms.   Notices heart feels like it is skipping at times.  Pt may have a cup of tea in the am.  Still having brain fog s/p COVID 19 infection May 2022.  ROS: See pertinent positives and negatives per HPI.  Past Medical History:  Diagnosis Date   Benign essential blepharospasm    Hypertension    no meds   IBS (irritable bowel syndrome)    Migraines    UTI (urinary tract infection)     Past Surgical History:  Procedure Laterality Date   BLEPHAROPLASTY     EYE SURGERY  2014   SLK/ eyelid surgery/ Bil  eyes   ovary removed     right side/ over 49 years    Family History  Problem Relation Age of Onset   Alzheimer's disease Mother    Heart failure Mother    Arthritis Mother    Depression Mother    Diabetes Mother    Hearing loss Father    Heart attack Father    Arthritis Sister    Hypertension Brother    Arthritis Maternal Grandmother      Current Outpatient Medications:    albuterol (VENTOLIN HFA) 108 (90 Base) MCG/ACT inhaler, Inhale 2 puffs into the lungs every 6 (six) hours as needed for wheezing or shortness of breath., Disp: 8 g, Rfl: 6   azelastine (ASTELIN) 0.1 % nasal spray, Place into both nostrils 2 (two) times daily. Use in each nostril as directed, Disp: , Rfl:    calcium carbonate (OS-CAL) 1250 (500 Ca) MG chewable tablet, Chew 1 tablet by mouth 3 (three) times daily., Disp: , Rfl:    CETIRIZINE HCL PO, Take by mouth., Disp: , Rfl:    clonazePAM (KLONOPIN) 0.5 MG tablet, Take 1 tablet (0.5 mg total) by mouth 2 (two) times daily as needed for anxiety., Disp: 60 tablet, Rfl: 0   clonazePAM (KLONOPIN) 0.5 MG tablet, clonazepam 0.5 mg tablet  Take 1 tablet twice a day by oral route as needed., Disp: , Rfl:    co-enzyme Q-10 30 MG capsule, Take 30  mg by mouth 3 (three) times daily., Disp: , Rfl:    fluticasone furoate-vilanterol (BREO ELLIPTA) 200-25 MCG/ACT AEPB, Inhale 1 puff into the lungs daily., Disp: 60 each, Rfl: 3   lisinopril (ZESTRIL) 5 MG tablet, Take 5 mg by mouth daily., Disp: , Rfl:    loperamide (IMODIUM) 2 MG capsule, Take by mouth as needed for diarrhea or loose stools., Disp: , Rfl:    Melatonin 5 MG CAPS, Take 5 mg by mouth daily., Disp: , Rfl:    methylphenidate (RITALIN) 10 MG tablet, Take 10 mg by mouth as needed., Disp: , Rfl:    OnabotulinumtoxinA (BOTOX IJ), Inject as directed. Inject into eyelids every 90 days, Disp: , Rfl:    Phenylephrine HCl (AFRIN ALLERGY NA), Place into the nose as needed. Diluted with ocean nasal spray, Disp: , Rfl:     sertraline (ZOLOFT) 100 MG tablet, TAKE 1 TABLET BY MOUTH EVERY DAY, Disp: 90 tablet, Rfl: 0   thiamine (VITAMIN B-1) 100 MG tablet, Take 100 mg by mouth daily., Disp: , Rfl:   EXAM:  VITALS per patient if applicable: RR between 82-64 bpm  GENERAL: alert, oriented, appears well and in no acute distress  HEENT: atraumatic, conjunctiva clear, no obvious abnormalities on inspection of external nose and ears  NECK: normal movements of the head and neck  LUNGS: Intermittent SOB with conversation,  on inspection no signs of respiratory distress, breathing rate appears normal, no obvious gross wheezing  CV: no obvious cyanosis  MS: moves all visible extremities without noticeable abnormality  PSYCH/NEURO: pleasant and cooperative, no obvious depression or anxiety, speech and thought processing grossly intact  ASSESSMENT AND PLAN:  Discussed the following assessment and plan:  Adjustment disorder with other symptoms  -Stable -We will wean Zoloft 100 mg to Zoloft 75 mg daily. - Plan: sertraline (ZOLOFT) 25 MG tablet  Blepharospasm of both eyes -Stable -Continue follow-up with ophthalmologist -Continue Ritalin as needed, Botox injections every 3 months, Klonopin as needed  DOE (dyspnea on exertion)  -Worsening -We will explore cardiac etiology given continued symptoms despite asthma management - Plan: EKG 12-Lead  Palpitations -We will have patient come to clinic later this week to obtain EKG and possibly repeat labs. -Continue to limit caffeine intake, decrease stress - Plan: EKG 12-Lead  Brain fog -Likely 2/2 a residual symptom of COVID-19 virus infection from May 2022  Mild persistent asthma without complication -Continue Breo and albuterol as needed -Continue follow-up with pulmonology, Dr. Silas Flood  Will have pt follow-up in clinic in 2 days for EKG and other evaluation.  I discussed the assessment and treatment plan with the patient. The patient was provided an  opportunity to ask questions and all were answered. The patient agreed with the plan and demonstrated an understanding of the instructions.   The patient was advised to call back or seek an in-person evaluation if the symptoms worsen or if the condition fails to improve as anticipated.   Billie Ruddy, MD

## 2022-01-08 ENCOUNTER — Ambulatory Visit (INDEPENDENT_AMBULATORY_CARE_PROVIDER_SITE_OTHER): Payer: Medicare Other | Admitting: Family Medicine

## 2022-01-08 ENCOUNTER — Encounter: Payer: Self-pay | Admitting: Family Medicine

## 2022-01-08 VITALS — BP 128/76 | HR 72 | Temp 98.1°F | Wt 159.0 lb

## 2022-01-08 DIAGNOSIS — U099 Post covid-19 condition, unspecified: Secondary | ICD-10-CM

## 2022-01-08 DIAGNOSIS — G249 Dystonia, unspecified: Secondary | ICD-10-CM

## 2022-01-08 DIAGNOSIS — R0609 Other forms of dyspnea: Secondary | ICD-10-CM

## 2022-01-08 DIAGNOSIS — G245 Blepharospasm: Secondary | ICD-10-CM

## 2022-01-08 DIAGNOSIS — R5383 Other fatigue: Secondary | ICD-10-CM | POA: Diagnosis not present

## 2022-01-08 DIAGNOSIS — M79662 Pain in left lower leg: Secondary | ICD-10-CM

## 2022-01-08 LAB — CBC WITH DIFFERENTIAL/PLATELET
Basophils Absolute: 0 10*3/uL (ref 0.0–0.1)
Basophils Relative: 0.7 % (ref 0.0–3.0)
Eosinophils Absolute: 0.1 10*3/uL (ref 0.0–0.7)
Eosinophils Relative: 1.6 % (ref 0.0–5.0)
HCT: 41.6 % (ref 36.0–46.0)
Hemoglobin: 13.7 g/dL (ref 12.0–15.0)
Lymphocytes Relative: 26 % (ref 12.0–46.0)
Lymphs Abs: 1.4 10*3/uL (ref 0.7–4.0)
MCHC: 33 g/dL (ref 30.0–36.0)
MCV: 90.2 fl (ref 78.0–100.0)
Monocytes Absolute: 0.4 10*3/uL (ref 0.1–1.0)
Monocytes Relative: 8.2 % (ref 3.0–12.0)
Neutro Abs: 3.5 10*3/uL (ref 1.4–7.7)
Neutrophils Relative %: 63.5 % (ref 43.0–77.0)
Platelets: 190 10*3/uL (ref 150.0–400.0)
RBC: 4.62 Mil/uL (ref 3.87–5.11)
RDW: 13.2 % (ref 11.5–15.5)
WBC: 5.5 10*3/uL (ref 4.0–10.5)

## 2022-01-08 LAB — TSH: TSH: 2.49 u[IU]/mL (ref 0.35–5.50)

## 2022-01-08 LAB — T4, FREE: Free T4: 0.81 ng/dL (ref 0.60–1.60)

## 2022-01-08 LAB — BASIC METABOLIC PANEL
BUN: 22 mg/dL (ref 6–23)
CO2: 32 mEq/L (ref 19–32)
Calcium: 10 mg/dL (ref 8.4–10.5)
Chloride: 104 mEq/L (ref 96–112)
Creatinine, Ser: 0.76 mg/dL (ref 0.40–1.20)
GFR: 78.53 mL/min (ref >60.00)
Glucose, Bld: 86 mg/dL (ref 70–99)
Potassium: 4.6 mEq/L (ref 3.5–5.1)
Sodium: 141 mEq/L (ref 135–145)

## 2022-01-08 LAB — MAGNESIUM: Magnesium: 2.3 mg/dL (ref 1.5–2.5)

## 2022-01-08 MED ORDER — GABAPENTIN 100 MG PO CAPS: 100.0000 mg | ORAL_CAPSULE | Freq: Two times a day (BID) | ORAL | 0 refills | Status: DC

## 2022-01-08 NOTE — Progress Notes (Signed)
Subjective:    Patient ID: Patricia Henry, female    DOB: [birth year], 71 y.o.   MRN: [id number]  Chief Complaint  Patient presents with   Follow-up    From virtual visit EKG    HPI Patient was seen today for follow-up from virtual visit on 01/06/2022.  Patient asked to come in for EKG.  Continued DOE despite use of Breo and albuterol inhalers.  States symptoms have become worse in the last few weeks.  Patient denies weakness of voice at the end of the day, spasms of other muscles, changes in meds.  Also with continued fatigue status post COVID-19 infection over the summer 2022.  Past Medical History:  Diagnosis Date   Benign essential blepharospasm    Hypertension    no meds   IBS (irritable bowel syndrome)    Migraines    UTI (urinary tract infection)     No Known Allergies  ROS General: Denies fever, chills, night sweats, changes in weight, changes in appetite + fatigue HEENT: Denies headaches, ear pain, changes in vision, rhinorrhea, sore throat CV: Denies CP, palpitations, SOB, orthopnea  +SOB Pulm: Denies SOB, cough, wheezing  +SOB GI: Denies abdominal pain, nausea, vomiting, diarrhea, constipation GU: Denies dysuria, hematuria, frequency, vaginal discharge Msk: Denies muscle cramps, joint pains  +L calf discomfort Neuro: Denies weakness, numbness, tingling Skin: Denies rashes, bruising Psych: Denies depression, anxiety, hallucinations     Objective:    Blood pressure 128/76, pulse 72, temperature 98.1 F (36.7 C), temperature source Oral, weight 159 lb (72.1 kg), SpO2 97 %.  Gen. Pleasant, well-nourished, visibly gasping for air during conversation and at rest, in no distress, normal affect   HEENT: [place]/AT, face symmetric, conjunctiva clear, no scleral icterus, PERRLA, EOMI, nares patent without drainage Lungs: Pausing to take in deep breaths during conversation and at rest, no accessory muscle use, CTAB, no wheezes or rales Cardiovascular: RRR, no m/r/g, no peripheral  edema.  No calf edema or erythema bilaterally.  Negative Homans' sign bilaterally.  Questionable tenderness of left calf Neuro:  A&Ox3, CN II-XII intact, normal gait Skin:  Warm, no lesions/ rash  Wt Readings from Last 3 Encounters:  01/08/22 159 lb (72.1 kg)  12/16/21 161 lb (73 kg)  10/27/21 162 lb (73.5 kg)    Lab Results  Component Value Date   WBC 4.5 11/04/2021   HGB 13.5 11/04/2021   HCT 40.6 11/04/2021   PLT 177 11/04/2021   GLUCOSE 71 10/15/2021   TRIG 77 12/04/2021   HDL 90 (A) 12/04/2021   LDLCALC 215 12/04/2021   ALT 19 06/29/2021   AST 17 06/29/2021   NA 142 10/15/2021   K 4.0 10/15/2021   CL 107 10/15/2021   CREATININE 0.79 10/15/2021   BUN 20 10/15/2021   CO2 28 10/15/2021   TSH 3.120 11/04/2021   HGBA1C 5.2 11/04/2021    Assessment/Plan:  DOE (dyspnea on exertion) -pO2 at 97% on room air -Consider atypical causes including spasm of diaphragm given history of blepharospasms. -EKG this visit NSR.  S wave in lead I, lead III with Q waves and inverted T waves.  RR prime wave in aVF.  No prior studies for comparison.  Though nonspecific will obtain D-dimer as must consider PE -We will start trial of gabapentin 100 mg twice daily to see if there is an improvement in gasps - Plan: D-dimer, Quantitative, TSH, T4, Free, gabapentin (NEURONTIN) 100 MG capsule.  EKG ordered during virtual visit.  Dystonia -Possibly related to blepharospasm -  Plan: Magnesium, Basic metabolic panel, TSH, T4, Free, gabapentin (NEURONTIN) 100 MG capsule  Pain of left calf -Questionable tenderness of left calf on exam.  Felt different not necessarily painful - Plan: D-dimer, Quantitative, CBC with Differential/Platelet, VAS [place] LOWER EXTREMITY VENOUS (DVT)  Blepharospasm of both eyes -Mild increase as every 3 months Botox injection almost due. -Continue current medications Ritalin as needed, clonazepam 0.5 mg as needed, and eyedrops -Continue follow-up with  ophthalmology  Persistent fatigue after COVID-19 -OTC supplements such as magnesium and zinc  F/u as needed in the next few weeks  Grier Mitts, MD

## 2022-01-09 LAB — D-DIMER, QUANTITATIVE: D-Dimer, Quant: 0.35 mcg/mL FEU (ref <0.50)

## 2022-01-11 ENCOUNTER — Telehealth: Payer: Self-pay | Admitting: Family Medicine

## 2022-01-11 NOTE — Telephone Encounter (Signed)
Patient called because the order for the vascular ultrasound that Dr.Banks put in to [facility], cannot be done there as they do not do those. Order will need to go somewhere else.    Please advise

## 2022-01-12 NOTE — Addendum Note (Signed)
Addended by: Rodrigo Ran on: 01/12/2022 08:43 AM   Modules accepted: Orders

## 2022-01-12 NOTE — Telephone Encounter (Signed)
Order updated

## 2022-01-13 ENCOUNTER — Ambulatory Visit (HOSPITAL_COMMUNITY)
Admission: RE | Admit: 2022-01-13 | Discharge: 2022-01-13 | Disposition: A | Discharge status: Home / Self Care | Payer: Medicare Other | Source: Ambulatory Visit | Attending: Family Medicine | Admitting: Family Medicine

## 2022-01-13 ENCOUNTER — Telehealth: Payer: Self-pay

## 2022-01-13 ENCOUNTER — Other Ambulatory Visit: Payer: Self-pay

## 2022-01-13 DIAGNOSIS — M79662 Pain in left lower leg: Secondary | ICD-10-CM | POA: Diagnosis not present

## 2022-01-13 DIAGNOSIS — R0602 Shortness of breath: Secondary | ICD-10-CM | POA: Diagnosis not present

## 2022-01-13 DIAGNOSIS — G245 Blepharospasm: Secondary | ICD-10-CM | POA: Diagnosis not present

## 2022-01-13 NOTE — Telephone Encounter (Signed)
Cardiovascular imaging on [address] called for preliminary report , stated patient is negative for DVT

## 2022-01-19 ENCOUNTER — Ambulatory Visit
Admission: RE | Admit: 2022-01-19 | Discharge: 2022-01-19 | Disposition: A | Discharge status: Home / Self Care | Payer: Medicare Other | Source: Ambulatory Visit | Attending: Family Medicine | Admitting: Family Medicine

## 2022-01-19 ENCOUNTER — Encounter: Payer: Self-pay | Admitting: Pulmonary Disease

## 2022-01-19 ENCOUNTER — Other Ambulatory Visit: Payer: Self-pay

## 2022-01-19 DIAGNOSIS — M85832 Other specified disorders of bone density and structure, left forearm: Secondary | ICD-10-CM | POA: Diagnosis not present

## 2022-01-19 DIAGNOSIS — E2839 Other primary ovarian failure: Secondary | ICD-10-CM

## 2022-01-19 DIAGNOSIS — Z780 Asymptomatic menopausal state: Secondary | ICD-10-CM | POA: Diagnosis not present

## 2022-01-19 NOTE — Telephone Encounter (Signed)
Please advise on mychart.  ?

## 2022-01-19 NOTE — Telephone Encounter (Signed)
"  Dear Dr Silas Flood, You also gave me a prescription for an albuterol inhaler and I have tried it on several occasions before I went for a walk. However, I did not notice that it helped either. What other inhaler might you recommend? Thank you!"  MH, can you please advise? Thanks!

## 2022-01-20 LAB — HM DEXA SCAN

## 2022-01-21 ENCOUNTER — Encounter: Payer: Self-pay | Admitting: Family Medicine

## 2022-01-22 ENCOUNTER — Encounter: Payer: Self-pay | Admitting: Family Medicine

## 2022-01-26 ENCOUNTER — Other Ambulatory Visit: Payer: Self-pay | Admitting: Family Medicine

## 2022-01-26 DIAGNOSIS — I10 Essential (primary) hypertension: Secondary | ICD-10-CM

## 2022-01-26 NOTE — Telephone Encounter (Signed)
Called pt to confirm correct dosage of lisinopril. Pt states she is taking 5mg  daily.

## 2022-02-03 ENCOUNTER — Ambulatory Visit (INDEPENDENT_AMBULATORY_CARE_PROVIDER_SITE_OTHER): Payer: Medicare Other | Admitting: Family Medicine

## 2022-02-03 ENCOUNTER — Encounter: Payer: Self-pay | Admitting: Family Medicine

## 2022-02-03 VITALS — BP 126/68 | HR 75 | Temp 97.8°F | Wt 158.4 lb

## 2022-02-03 DIAGNOSIS — R0609 Other forms of dyspnea: Secondary | ICD-10-CM | POA: Diagnosis not present

## 2022-02-03 DIAGNOSIS — R5383 Other fatigue: Secondary | ICD-10-CM

## 2022-02-03 DIAGNOSIS — U099 Post covid-19 condition, unspecified: Secondary | ICD-10-CM

## 2022-02-03 NOTE — Progress Notes (Signed)
Subjective:    Patient ID: Patricia Henry, female    DOB: 1951/02/01, 71 y.o.   MRN: [id number]  Chief Complaint  Patient presents with   Follow-up    SOB is getting worse, extreme fatigue    HPI Patient was seen today for f/u.  Pt did not notice any difference in gasping/DOE with gabapentin 100 mg.  Patient feels like she cannot get a good full breath in during conversation and has dyspnea going up stairs or walking an up an incline.  Denies wheezing.  Pt tried the medication x 2 days.  Seen by Pulmonology.  PFTs showed improvement with albuterol however Breo inhaler did not improve symptoms.  Pt feels like symptoms are getting worse as is her fatigue.  Patient states she is exhausted.  Fatigue started after COVID 19 virus infection summer 2022.    Past Medical History:  Diagnosis Date   Benign essential blepharospasm    Hypertension    no meds   IBS (irritable bowel syndrome)    Migraines    UTI (urinary tract infection)     No Known Allergies  ROS General: Denies fever, chills, night sweats, changes in weight, changes in appetite + fatigue HEENT: Denies headaches, ear pain, changes in vision, rhinorrhea, sore throat CV: Denies CP, palpitations, SOB, orthopnea  Pulm: Denies SOB, cough, wheezing  +DOE GI: Denies abdominal pain, nausea, vomiting, diarrhea, constipation GU: Denies dysuria, hematuria, frequency, vaginal discharge Msk: Denies muscle cramps, joint pains Neuro: Denies weakness, numbness, tingling Skin: Denies rashes, bruising Psych: Denies depression, anxiety, hallucinations     Objective:    Blood pressure 126/68, pulse 75, temperature 97.8 F (36.6 C), temperature source Oral, weight 158 lb 6.4 oz (71.8 kg), SpO2 99 %.  Gen. Pleasant, well-nourished, in no distress, normal affect.  Intermittent pauses/gasp for air during conversation HEENT: [place]/AT, face symmetric, conjunctiva clear, no scleral icterus, PERRLA, EOMI, nares patent without drainage. Lungs:  Intermittent pauses/gas during conversation, no accessory muscle use, CTAB, no wheezes or rales Cardiovascular: RRR, no m/r/g, no peripheral edema Musculoskeletal: No deformities, no cyanosis or clubbing, normal tone Neuro:  A&Ox3, CN II-XII intact, normal gait Skin:  Warm, no lesions/ rash   Wt Readings from Last 3 Encounters:  02/03/22 158 lb 6.4 oz (71.8 kg)  01/08/22 159 lb (72.1 kg)  12/16/21 161 lb (73 kg)    Lab Results  Component Value Date   WBC 5.5 01/08/2022   HGB 13.7 01/08/2022   HCT 41.6 01/08/2022   PLT 190.0 01/08/2022   GLUCOSE 86 01/08/2022   TRIG 77 12/04/2021   HDL 90 (A) 12/04/2021   LDLCALC 215 12/04/2021   ALT 19 06/29/2021   AST 17 06/29/2021   NA 141 01/08/2022   K 4.6 01/08/2022   CL 104 01/08/2022   CREATININE 0.76 01/08/2022   BUN 22 01/08/2022   CO2 32 01/08/2022   TSH 2.49 01/08/2022   HGBA1C 5.2 11/04/2021    Assessment/Plan:  DOE (dyspnea on exertion) -Chronic, worsening problem -Reviewed possible causes of symptoms.  So far work-up unrevealing. -CXR on 10/15/2021 without acute processes -Pulmonology work-up with mild persistent asthma without complication however inhalers not improving symptoms. -Trial of gabapentin 100 mg for possible phrenic nerve irritation causing diaphragmatic spasms was not helpful. -EKG from 01/08/2022 with NSR -D-dimer from 01/08/2022 -We will place referral to cardiology for possible stress test. - Plan: Ambulatory referral to Cardiology  Persistent fatigue after COVID-19 -Chronic problem status post COVID-19 virus infection summer 2022. - Plan:  Ambulatory referral to Cardiology  F/u as needed  Grier Mitts, MD

## 2022-02-04 NOTE — Progress Notes (Signed)
Cardiology Office Note:    Date:  02/05/2022   ID:  SHAKEITHA UMBAUGH, DOB 11/02/1951, MRN [id number]  PCP:  Billie Ruddy, MD   [facility] Providers Cardiologist:  Lenna Sciara, MD Referring MD: Billie Ruddy, MD   Chief Complaint/Reason for Referral: Fatigue  ASSESSMENT:    Fatigue, unspecified type - Plan: ECHOCARDIOGRAM COMPLETE  Essential hypertension - Plan: ECHOCARDIOGRAM COMPLETE    PLAN:    In order of problems listed above:  1.  We will obtain an echocardiogram to evaluate.  If the echocardiogram is abnormal this may lead to a cardiac catheterization to look at her coronaries or perhaps a right heart catheterization could be considered to definitively evaluate for volume overload.  However I am concerned about her disordered breathing pattern and whether this might represent a neuromuscular condition perhaps affecting diaphragmatic function.  I will keep follow-up with me open-ended.  2.  Blood pressures well controlled today             Dispo:  Return if symptoms worsen or fail to improve.     Medication Adjustments/Labs and Tests Ordered: Current medicines are reviewed at length with the patient today.  Concerns regarding medicines are outlined above.   Tests Ordered: Orders Placed This Encounter  Procedures   ECHOCARDIOGRAM COMPLETE    Medication Changes: No orders of the defined types were placed in this encounter.   History of Present Illness:    FOCUSED CARDIOVASCULAR PROBLEM LIST:   1.  Hypertension 2.  COVID infection 5/22  The patient is a 71 y.o. female with the indicated medical history here for recommendations regarding fatigue.  The patient was seen by her primary care provider recently.  This has been going on since last summer after she developed COVID.  She is also noted shortness of breath.  She was seen by pulmonology and PFTs demonstrated some reversibility with albuterol but otherwise normal PFTs.  She has noticed that  she has had increasing shortness of breath since her COVID infection.  Interestingly during my observation of her she takes a few deep size in order to catch her breath.  She tells me this has been getting worse over the last several weeks and since she had PFTs done.  She denies any chest pain, palpitations, paroxysmal nocturnal dyspnea, or orthopnea.  She feels like she cannot get enough air at times.  She denies any paresthesias or neurological symptoms.  She denies any fevers or chills.  She has had no peripheral edema.       Previous Medical History: Past Medical History:  Diagnosis Date   Benign essential blepharospasm    Fatigue    Hypertension    no meds   IBS (irritable bowel syndrome)    Migraines    SOB (shortness of breath)    UTI (urinary tract infection)      Current Medications: Current Meds  Medication Sig   albuterol (VENTOLIN HFA) 108 (90 Base) MCG/ACT inhaler Inhale 2 puffs into the lungs every 6 (six) hours as needed for wheezing or shortness of breath.   azelastine (ASTELIN) 0.1 % nasal spray Place into both nostrils 2 (two) times daily. Use in each nostril as directed   calcium carbonate (OS-CAL) 1250 (500 Ca) MG chewable tablet Chew 1 tablet by mouth 3 (three) times daily.   CETIRIZINE HCL PO Take by mouth.   clonazePAM (KLONOPIN) 0.5 MG tablet Take 1 tablet (0.5 mg total) by mouth 2 (two) times daily as needed  for anxiety.   co-enzyme Q-10 30 MG capsule Take 30 mg by mouth 3 (three) times daily.   Cyanocobalamin (B-12 PO) Take by mouth.   fluticasone furoate-vilanterol (BREO ELLIPTA) 200-25 MCG/ACT AEPB Inhale 1 puff into the lungs daily.   gabapentin (NEURONTIN) 100 MG capsule Take 1 capsule (100 mg total) by mouth 2 (two) times daily.   lisinopril (ZESTRIL) 5 MG tablet Take 1 tablet (5 mg total) by mouth daily.   loperamide (IMODIUM) 2 MG capsule Take by mouth as needed for diarrhea or loose stools.   Melatonin 5 MG CAPS Take 5 mg by mouth daily.    methylphenidate (RITALIN) 10 MG tablet Take 10 mg by mouth as needed.   Omega-3 1000 MG CAPS Take by mouth.   OnabotulinumtoxinA (BOTOX IJ) Inject as directed. Inject into eyelids every 90 days   Phenylephrine HCl (AFRIN ALLERGY NA) Place into the nose as needed. Diluted with ocean nasal spray   sertraline (ZOLOFT) 25 MG tablet Take 3 tablets (75 mg total) by mouth daily.     Allergies:    Patient has no known allergies.   Social History:   Social History   Tobacco Use   Smoking status: Former    Types: Cigarettes    Quit date: 05/23/2013    Years since quitting: 8.7   Smokeless tobacco: Never  Vaping Use   Vaping Use: Never used  Substance Use Topics   Alcohol use: Yes    Alcohol/week: 7.0 standard drinks    Types: 7 Glasses of wine per week    Comment: week   Drug use: No     Family Hx: Family History  Problem Relation Age of Onset   Alzheimer's disease Mother    Heart failure Mother    Arthritis Mother    Depression Mother    Diabetes Mother    Hearing loss Father    Heart attack Father    Arthritis Sister    Hypertension Brother    Arthritis Maternal Grandmother      Review of Systems:   Please see the history of present illness.    All other systems reviewed and are negative.     EKGs/Labs/Other Test Reviewed:    EKG: January 2023 EKG sinus rhythm  Prior CV studies: None available  Imaging studies that I have independently reviewed today: None relevant  Recent Labs: 06/29/2021: ALT 19 10/15/2021: Pro B Natriuretic peptide (BNP) 64.0 01/08/2022: BUN 22; Creatinine, Ser 0.76; Hemoglobin 13.7; Magnesium 2.3; Platelets 190.0; Potassium 4.6; Sodium 141; TSH 2.49   Recent Lipid Panel Lab Results  Component Value Date/Time   TRIG 77 12/04/2021 12:00 AM   HDL 90 (A) 12/04/2021 12:00 AM   LDLCALC 215 12/04/2021 12:00 AM    Risk Assessment/Calculations:          Physical Exam:    VS:  BP 124/80    Pulse 77    Ht 5\' 7"  (1.702 m)    Wt 157 lb (71.2  kg)    SpO2 99%    BMI 24.59 kg/m    Wt Readings from Last 3 Encounters:  02/05/22 157 lb (71.2 kg)  02/03/22 158 lb 6.4 oz (71.8 kg)  01/08/22 159 lb (72.1 kg)    GENERAL:  No apparent distress, AOx3 HEENT:  No carotid bruits, +2 carotid impulses, no scleral icterus CAR: RRR no murmurs, gallops, rubs, or thrills RES:  Clear to auscultation bilaterally ABD:  Soft, nontender, nondistended, positive bowel sounds x 4 VASC:  +2  radial pulses, +2 carotid pulses, palpable pedal pulses NEURO:  CN 2-12 grossly intact; motor and sensory grossly intact PSYCH:  No active depression or anxiety EXT:  No edema, ecchymosis, or cyanosis  Signed, Early Osmond, MD  02/05/2022 11:09 AM    [address], [place], [place]  [address] Phone: [phone]; Fax: [phone]   Note:  This document was prepared using Dragon voice recognition software and may include unintentional dictation errors.

## 2022-02-05 ENCOUNTER — Other Ambulatory Visit: Payer: Self-pay

## 2022-02-05 ENCOUNTER — Encounter: Payer: Self-pay | Admitting: Family Medicine

## 2022-02-05 ENCOUNTER — Ambulatory Visit (INDEPENDENT_AMBULATORY_CARE_PROVIDER_SITE_OTHER): Payer: Medicare Other | Admitting: Internal Medicine

## 2022-02-05 ENCOUNTER — Encounter: Payer: Self-pay | Admitting: Internal Medicine

## 2022-02-05 VITALS — BP 124/80 | HR 77 | Ht 67.0 in | Wt 157.0 lb

## 2022-02-05 DIAGNOSIS — R5383 Other fatigue: Secondary | ICD-10-CM | POA: Diagnosis not present

## 2022-02-05 DIAGNOSIS — I10 Essential (primary) hypertension: Secondary | ICD-10-CM

## 2022-02-05 NOTE — Patient Instructions (Signed)
Medication Instructions:  No changes *If you need a refill on your cardiac medications before your next appointment, please call your pharmacy*   Lab Work: none If you have labs (blood work) drawn today and your tests are completely normal, you will receive your results only by: [place] (if you have MyChart) OR A paper copy in the mail If you have any lab test that is abnormal or we need to change your treatment, we will call you to review the results.   Testing/Procedures: Your physician has requested that you have an echocardiogram. Echocardiography is a painless test that uses sound waves to create images of your heart. It provides your doctor with information about the size and shape of your heart and how well your hearts chambers and valves are working. This procedure takes approximately one hour. There are no restrictions for this procedure.   Follow-Up: As needed with Dr. Ali Lowe   Other Instructions

## 2022-02-09 ENCOUNTER — Ambulatory Visit (HOSPITAL_COMMUNITY): Payer: Medicare Other | Attending: Internal Medicine

## 2022-02-09 ENCOUNTER — Encounter: Payer: Self-pay | Admitting: Internal Medicine

## 2022-02-09 ENCOUNTER — Other Ambulatory Visit: Payer: Self-pay

## 2022-02-09 DIAGNOSIS — I10 Essential (primary) hypertension: Secondary | ICD-10-CM | POA: Diagnosis not present

## 2022-02-09 DIAGNOSIS — R5383 Other fatigue: Secondary | ICD-10-CM

## 2022-02-09 LAB — ECHOCARDIOGRAM COMPLETE
Area-P 1/2: 2.28 cm2
S' Lateral: 2.2 cm

## 2022-02-10 NOTE — Telephone Encounter (Signed)
"  Let patient know echo shows normal heart function without significant valvular issues"  Called and informed patient of echo results.  She is grateful for information provided.

## 2022-02-11 ENCOUNTER — Other Ambulatory Visit: Payer: Self-pay | Admitting: Family Medicine

## 2022-02-11 DIAGNOSIS — J986 Disorders of diaphragm: Secondary | ICD-10-CM

## 2022-02-11 DIAGNOSIS — R0609 Other forms of dyspnea: Secondary | ICD-10-CM

## 2022-02-12 ENCOUNTER — Other Ambulatory Visit (INDEPENDENT_AMBULATORY_CARE_PROVIDER_SITE_OTHER): Payer: Medicare Other

## 2022-02-12 ENCOUNTER — Ambulatory Visit (INDEPENDENT_AMBULATORY_CARE_PROVIDER_SITE_OTHER): Payer: Medicare Other | Admitting: Neurology

## 2022-02-12 ENCOUNTER — Other Ambulatory Visit: Payer: Self-pay

## 2022-02-12 ENCOUNTER — Encounter: Payer: Self-pay | Admitting: Neurology

## 2022-02-12 VITALS — BP 143/73 | HR 83 | Resp 18 | Ht 67.0 in | Wt 160.0 lb

## 2022-02-12 DIAGNOSIS — R0609 Other forms of dyspnea: Secondary | ICD-10-CM | POA: Diagnosis not present

## 2022-02-12 NOTE — Patient Instructions (Addendum)
We will check labs and post to MyChart when available. Your provider has requested that you have labwork completed today. Please go to [facility] ([id number]) on the second floor of this building before leaving the office today. You do not need to check in. If you are not called within 15 minutes please check with the front desk.

## 2022-02-12 NOTE — Progress Notes (Signed)
[facility] Clinic Note - Initial Visit   Date: 02/12/22  Patricia Henry MRN: [id number] DOB: [birth year]   Dear Dr. Volanda Napoleon:  Thank you for your kind referral of Patricia Henry for consultation of shortness of breath. Although her history is well known to you, please allow [place] to reiterate it for the purpose of our medical record. The patient was accompanied to the clinic by self.   History of Present Illness: Patricia Henry is a 71 y.o. right-handed female with hypertension, migraines, blepharospasm, and anxiety presenting for evaluation of shortness of breath.  She had COVID in May 2022 and since this time reports having progressive shortness of breath with exertion or even with talking.  She feels like she is gasping for air.  Symptoms are not present at rest. She denies orthopnea and sleeping on 1 pillow.  She has been evaluated by pulmonology who offered medication for asthma, although she has not noticed that it helped.  Her cardiologist suggested that she see neurology to evaluate for diaphragmatic weakness.  Of note, there is no evidence of hemidiaphragm on CXR from October 2022. PFTs shows significant bronchodilator response, normal lung volumes and DLCO.  She denies double vision, droopiness of the eyes, difficulty swallowing or talking, or limb weakness.  She gets botox injections for blepharospasm every 90 days and denies worsening of shortness of breath with this.    She lives in a 3-level home with elevator.  She works part-time as a Programme researcher, broadcasting/film/video.  Her husband passed away last year.    Out-side paper records, electronic medical record, and images have been reviewed where available and summarized as:  Lab Results  Component Value Date   HGBA1C 5.2 11/04/2021   Lab Results  Component Value Date   VITAMINB12 1,455 (H) 06/29/2021   Lab Results  Component Value Date   TSH 2.49 01/08/2022     Past Medical History:  Diagnosis Date   Benign  essential blepharospasm    Fatigue    Hypertension    no meds   IBS (irritable bowel syndrome)    Migraines    SOB (shortness of breath)    UTI (urinary tract infection)     Past Surgical History:  Procedure Laterality Date   BLEPHAROPLASTY     EYE SURGERY  2014   SLK/ eyelid surgery/ Bil eyes   ovary removed     right side/ over 20 years     Medications:  Outpatient Encounter Medications as of 02/12/2022  Medication Sig   CETIRIZINE HCL PO Take by mouth.   clonazePAM (KLONOPIN) 0.5 MG tablet Take 1 tablet (0.5 mg total) by mouth 2 (two) times daily as needed for anxiety.   clonazePAM (KLONOPIN) 0.5 MG tablet    co-enzyme Q-10 30 MG capsule Take 30 mg by mouth 3 (three) times daily.   Cyanocobalamin (B-12 PO) Take by mouth.   lisinopril (ZESTRIL) 5 MG tablet Take 1 tablet (5 mg total) by mouth daily.   loperamide (IMODIUM) 2 MG capsule Take by mouth as needed for diarrhea or loose stools.   methylphenidate (RITALIN) 10 MG tablet Take 10 mg by mouth as needed.   Nitric Acid LIQD by Does not apply route.   Omega-3 1000 MG CAPS Take by mouth.   OnabotulinumtoxinA (BOTOX IJ) Inject as directed. Inject into eyelids every 90 days   Phenylephrine HCl (AFRIN ALLERGY NA) Place into the nose as needed. Diluted with ocean nasal spray   sertraline (ZOLOFT) 25  MG tablet Take 3 tablets (75 mg total) by mouth daily.   albuterol (VENTOLIN HFA) 108 (90 Base) MCG/ACT inhaler Inhale 2 puffs into the lungs every 6 (six) hours as needed for wheezing or shortness of breath.   azelastine (ASTELIN) 0.1 % nasal spray Place into both nostrils 2 (two) times daily. Use in each nostril as directed (Patient not taking: Reported on 02/12/2022)   calcium carbonate (OS-CAL) 1250 (500 Ca) MG chewable tablet Chew 1 tablet by mouth 3 (three) times daily.   fluticasone furoate-vilanterol (BREO ELLIPTA) 200-25 MCG/ACT AEPB Inhale 1 puff into the lungs daily.   gabapentin (NEURONTIN) 100 MG capsule Take 1 capsule  (100 mg total) by mouth 2 (two) times daily.   Melatonin 5 MG CAPS Take 5 mg by mouth daily.   No facility-administered encounter medications on file as of 02/12/2022.    Allergies: No Known Allergies  Family History: Family History  Problem Relation Age of Onset   Alzheimer's disease Mother    Heart failure Mother    Arthritis Mother    Depression Mother    Diabetes Mother    Hearing loss Father    Heart attack Father    Arthritis Sister    Hypertension Brother    Arthritis Maternal Grandmother     Social History: Social History   Tobacco Use   Smoking status: Former    Types: Cigarettes    Quit date: 05/23/2013    Years since quitting: 8.7   Smokeless tobacco: Never  Vaping Use   Vaping Use: Never used  Substance Use Topics   Alcohol use: Yes    Alcohol/week: 7.0 standard drinks    Types: 7 Glasses of wine per week    Comment: week   Drug use: No   Social History   Social History Narrative   Patient consumes 2 cups of caffiene ,right handed   An elevator, 3 storys    Vital Signs:  BP (!) 143/73    Pulse 83    Resp 18    Ht 5\' 7"  (1.702 m)    Wt 160 lb (72.6 kg)    SpO2 95%    BMI 25.06 kg/m   Neurological Exam: MENTAL STATUS including orientation to time, place, person, recent and remote memory, attention span and concentration, language, and fund of knowledge is normal.  Speech is not dysarthric, she tends to talk in short sentences, taking deep breaths every 3-4 words, but this is not always consistent.  She does not appear to look in respiratory distress and very comfortably sitting.  CRANIAL NERVES: II:  No visual field defects.   III-IV-VI: Pupils equal round and reactive to light.  Normal conjugate, extra-ocular eye movements in all directions of gaze.  No nystagmus.  No ptosis at rest or with sustained upgaze.   V:  Normal facial sensation.    VII:  Normal facial symmetry and movements, except trace orbicularis weakness bilaterally (she received botox  for blepharospasm 1 month ago).  She is able to count to 30 on deep inhalation. VIII:  Normal hearing and vestibular function.   IX-X:  Normal palatal movement.   XI:  Normal shoulder shrug and head rotation.   XII:  Normal tongue strength and range of motion, no deviation or fasciculation.  MOTOR:  Motor strength is 5/5 throughout, including neck flexion.  No muscle fatigability. No atrophy, fasciculations or abnormal movements.  No pronator drift.   MSRs:  Right        Left  brachioradialis 2+  2+  biceps 2+  2+  triceps 2+  2+  patellar 2+  2+  ankle jerk 2+  2+  Hoffman no  no  plantar response down  down   SENSORY:  Normal and symmetric perception of light touch, pinprick, vibration, and proprioception.  Romberg's sign absent.   COORDINATION/GAIT: Normal finger-to- nose-finger.  Intact rapid alternating movements bilaterally.  Able to rise from a chair without using arms.  Tandem and stressed gait intact.    IMPRESSION: Dyspnea with exertion since May 2022 following COVID infection.  She does not demonstrate any neuromuscular weakness on exam, except trace weakness of orbicularis oris muscles as expected following her botox injection.  I do not see any sign of neuromuscular junction disorder, such as myasthenia gravis, in which respiratory complaints tend to be a very late presentation often leading to crisis.  No sign of myopathy either.  To be complete, I will check AChR antibodies, however my overall suspicion for myasthenia is very low.    Thank you for allowing me to participate in patient's care.  If I can answer any additional questions, I would be pleased to do so.    Sincerely,    Shamikia Linskey K. Posey Pronto, DO

## 2022-02-15 ENCOUNTER — Other Ambulatory Visit (HOSPITAL_COMMUNITY): Payer: Medicare Other

## 2022-02-15 LAB — SPECIMEN STATUS REPORT

## 2022-02-19 ENCOUNTER — Other Ambulatory Visit: Payer: Self-pay | Admitting: Family Medicine

## 2022-02-19 DIAGNOSIS — F4329 Adjustment disorder with other symptoms: Secondary | ICD-10-CM

## 2022-02-21 LAB — ACETYLCHOLINE RECEPTOR, BLOCKING: Acetylchol Block Ab: 15 % (ref 0–25)

## 2022-02-21 LAB — ACETYLCHOLINE RECEPTOR, BINDING: AChR Binding Ab, Serum: <0.03 nmol/L (ref 0.00–0.24)

## 2022-02-21 LAB — MUSK ANTIBODIES: MuSK Antibodies: <1 U/mL

## 2022-03-09 DIAGNOSIS — R140 Abdominal distension (gaseous): Secondary | ICD-10-CM | POA: Diagnosis not present

## 2022-03-09 DIAGNOSIS — K580 Irritable bowel syndrome with diarrhea: Secondary | ICD-10-CM | POA: Diagnosis not present

## 2022-03-09 DIAGNOSIS — R197 Diarrhea, unspecified: Secondary | ICD-10-CM | POA: Diagnosis not present

## 2022-03-10 ENCOUNTER — Encounter: Payer: Self-pay | Admitting: Family Medicine

## 2022-03-10 DIAGNOSIS — F4329 Adjustment disorder with other symptoms: Secondary | ICD-10-CM

## 2022-03-11 ENCOUNTER — Other Ambulatory Visit: Payer: Self-pay | Admitting: Family Medicine

## 2022-03-11 DIAGNOSIS — F4329 Adjustment disorder with other symptoms: Secondary | ICD-10-CM

## 2022-03-11 MED ORDER — SERTRALINE HCL 25 MG PO TABS: 75.0000 mg | ORAL_TABLET | Freq: Every day | ORAL | 3 refills | Status: DC

## 2022-03-12 ENCOUNTER — Other Ambulatory Visit: Payer: Self-pay | Admitting: Family Medicine

## 2022-03-12 DIAGNOSIS — F4329 Adjustment disorder with other symptoms: Secondary | ICD-10-CM

## 2022-03-16 ENCOUNTER — Telehealth: Payer: Self-pay | Admitting: Family Medicine

## 2022-03-16 ENCOUNTER — Ambulatory Visit (INDEPENDENT_AMBULATORY_CARE_PROVIDER_SITE_OTHER): Payer: Medicare Other | Admitting: Pulmonary Disease

## 2022-03-16 ENCOUNTER — Other Ambulatory Visit: Payer: Self-pay

## 2022-03-16 ENCOUNTER — Encounter: Payer: Self-pay | Admitting: Pulmonary Disease

## 2022-03-16 VITALS — BP 122/78 | HR 84 | Temp 98.8°F | Ht 67.0 in | Wt 155.0 lb

## 2022-03-16 DIAGNOSIS — R0609 Other forms of dyspnea: Secondary | ICD-10-CM | POA: Diagnosis not present

## 2022-03-16 DIAGNOSIS — U071 COVID-19: Secondary | ICD-10-CM

## 2022-03-16 NOTE — Progress Notes (Signed)
? ?'@Patient'$  ID: Patricia Henry, female    DOB: 1951/08/21, 71 y.o.   MRN: [id number] ? ?Chief Complaint  ?Patient presents with  ? Follow-up  ?  Breo and albuterol did not help her asthma. Has not been able to find out cause of SOB. Has questions about her asthma.   ? ? ?Referring provider: ?Billie Ruddy, MD ? ?HPI:  ? ?71 y.o. whom we are seeing in follow up for evaluation of dyspnea on exertion.   ? ?Returns to clinic for scheduled follow-up.  Still dyspneic when going up stairs, hills, exercising.  Overall otherwise doing okay.  Trial of Breo and albuterol for about 1 month.  No improvement.  Has stopped these medications.  She has been following with a new doctor.  Going through algorithms for post COVID syndrome, long COVID symptoms. ? ?HPI at initial visit: ?Patient was in normal state of health.  Developed COVID May 2022.  Initial symptoms improved.  Had lingering dyspnea on exertion.  Worse with stairs and inclines.  Persistence of lingered for several months.  Gradual improvement but still present.  No time of day to things are better or worse.  No position makes it better or worse.  No seasonal environmental factors he can identify that make things better or worse.  No other relieving or exacerbating factors.  Recently seen by PCP with chest x-ray 10/15/2021 on my review and interpretation with clear lungs bilaterally. ? ?PMH: Anxiety ?Surgical history: Eye surgery, oophorectomy ?Family history: Mother with Alzheimer's, heart failure, father with CAD ?Social history: Former smoker, quit 2014, lives in [place] ? ? ? ?Questionaires / Pulmonary Flowsheets:  ? ?ACT:  ?   ? View : No data to display.  ?  ?  ?  ? ? ?MMRC: ?mMRC Dyspnea Scale mMRC Score  ?12/16/2021 ?10:48 AM 1  ? ? ?Epworth:  ?   ? View : No data to display.  ?  ?  ?  ? ? ?Tests:  ? ?FENO:  ?No results found for: NITRICOXIDE ? ?PFT: ? ?  Latest Ref Rng & Units 12/16/2021  ?  9:51 AM  ?PFT Results  ?FVC-Pre L 2.60    ?FVC-Predicted Pre % 77     ?FVC-Post L 2.88    ?FVC-Predicted Post % 86    ?Pre FEV1/FVC % % 73    ?Post FEV1/FCV % % 77    ?FEV1-Pre L 1.90    ?FEV1-Predicted Pre % 75    ?FEV1-Post L 2.20    ?DLCO uncorrected ml/min/mmHg 18.41    ?DLCO UNC% % 85    ?DLCO corrected ml/min/mmHg 18.36    ?DLCO COR %Predicted % 85    ?DLVA Predicted % 92    ?TLC L 5.38    ?TLC % Predicted % 97    ?RV % Predicted % 111    ?Personally reviewed interpreted as: Spirometry normal with significant bronchodilator response with 300 cc or 15% improvement in FEV1 after albuterol.  Lung volumes normal, DLCO normal.  ? ?WALK:  ?   ? View : No data to display.  ?  ?  ?  ? ? ?Imaging: ?Personally reviewed as per EMR discussion this note ? ?Lab Results: ?Personally reviewed ?CBC ?   ?Component Value Date/Time  ? WBC 5.5 01/08/2022 1453  ? RBC 4.62 01/08/2022 1453  ? HGB 13.7 01/08/2022 1453  ? HGB 13.5 11/04/2021 1024  ? HCT 41.6 01/08/2022 1453  ? HCT 40.6 11/04/2021 1024  ? PLT 190.0  01/08/2022 1453  ? PLT 177 11/04/2021 1024  ? MCV 90.2 01/08/2022 1453  ? MCV 91 11/04/2021 1024  ? MCH 30.1 11/04/2021 1024  ? MCH 30.2 10/01/2020 1248  ? MCHC 33.0 01/08/2022 1453  ? RDW 13.2 01/08/2022 1453  ? RDW 13.1 11/04/2021 1024  ? LYMPHSABS 1.4 01/08/2022 1453  ? LYMPHSABS 1.0 11/04/2021 1024  ? MONOABS 0.4 01/08/2022 1453  ? EOSABS 0.1 01/08/2022 1453  ? EOSABS 0.1 11/04/2021 1024  ? BASOSABS 0.0 01/08/2022 1453  ? BASOSABS 0.0 11/04/2021 1024  ? ? ?BMET ?   ?Component Value Date/Time  ? NA 141 01/08/2022 1453  ? K 4.6 01/08/2022 1453  ? CL 104 01/08/2022 1453  ? CO2 32 01/08/2022 1453  ? GLUCOSE 86 01/08/2022 1453  ? BUN 22 01/08/2022 1453  ? CREATININE 0.76 01/08/2022 1453  ? CREATININE 0.83 10/01/2020 1248  ? CALCIUM 10.0 01/08/2022 1453  ? GFRNONAA 71 10/01/2020 1248  ? GFRAA 83 10/01/2020 1248  ? ? ?BNP ?No results found for: BNP ? ?ProBNP ?   ?Component Value Date/Time  ? PROBNP 64.0 10/15/2021 0911  ? ? ?Specialty Problems   ?None ? ? ?No Known Allergies ? ?Immunization  History  ?Administered Date(s) Administered  ? Fluad Quad(high Dose 65+) 10/25/2019, 12/09/2020  ? Influenza, High Dose Seasonal PF 02/03/2018, 10/12/2018  ? Influenza-Unspecified 09/22/2021  ? PFIZER(Purple Top)SARS-COV-2 Vaccination 01/14/2020, 02/04/2020, 08/20/2020, 03/30/2021  ? Pension scheme manager 18yr & up 10/13/2021  ? Pneumococcal Conjugate-13 02/11/2016  ? Pneumococcal Polysaccharide-23 08/20/2020  ? Tdap 11/23/2020  ? Zoster Recombinat (Shingrix) 01/20/2022  ? ? ?Past Medical History:  ?Diagnosis Date  ? Benign essential blepharospasm   ? Fatigue   ? Hypertension   ? no meds  ? IBS (irritable bowel syndrome)   ? Migraines   ? SOB (shortness of breath)   ? UTI (urinary tract infection)   ? ? ?Tobacco History: ?Social History  ? ?Tobacco Use  ?Smoking Status Former  ? Types: Cigarettes  ? Quit date: 05/23/2013  ? Years since quitting: 8.8  ?Smokeless Tobacco Never  ? ?Counseling given: Not Answered ? ? ?Continue to not smoke ? ?Outpatient Encounter Medications as of 03/16/2022  ?Medication Sig  ? azelastine (ASTELIN) 0.1 % nasal spray Place into both nostrils 2 (two) times daily. Use in each nostril as directed  ? clonazePAM (KLONOPIN) 0.5 MG tablet Take 1 tablet (0.5 mg total) by mouth 2 (two) times daily as needed for anxiety.  ? clonazePAM (KLONOPIN) 0.5 MG tablet   ? co-enzyme Q-10 30 MG capsule Take 30 mg by mouth 3 (three) times daily.  ? Cyanocobalamin (B-12 PO) Take by mouth.  ? lisinopril (ZESTRIL) 5 MG tablet Take 1 tablet (5 mg total) by mouth daily.  ? loperamide (IMODIUM) 2 MG capsule Take by mouth as needed for diarrhea or loose stools.  ? methylphenidate (RITALIN) 10 MG tablet Take 10 mg by mouth as needed.  ? Nitric Acid LIQD by Does not apply route.  ? Omega-3 1000 MG CAPS Take by mouth.  ? OnabotulinumtoxinA (BOTOX IJ) Inject as directed. Inject into eyelids every 90 days  ? Phenylephrine HCl (AFRIN ALLERGY NA) Place into the nose as needed. Diluted with ocean nasal  spray  ? sertraline (ZOLOFT) 25 MG tablet Take 3 tablets (75 mg total) by mouth daily.  ? [DISCONTINUED] albuterol (VENTOLIN HFA) 108 (90 Base) MCG/ACT inhaler Inhale 2 puffs into the lungs every 6 (six) hours as needed for wheezing or shortness  of breath.  ? [DISCONTINUED] calcium carbonate (OS-CAL) 1250 (500 Ca) MG chewable tablet Chew 1 tablet by mouth 3 (three) times daily.  ? [DISCONTINUED] CETIRIZINE HCL PO Take by mouth.  ? [DISCONTINUED] fluticasone furoate-vilanterol (BREO ELLIPTA) 200-25 MCG/ACT AEPB Inhale 1 puff into the lungs daily.  ? [DISCONTINUED] gabapentin (NEURONTIN) 100 MG capsule Take 1 capsule (100 mg total) by mouth 2 (two) times daily.  ? [DISCONTINUED] Melatonin 5 MG CAPS Take 5 mg by mouth daily.  ? ?No facility-administered encounter medications on file as of 03/16/2022.  ? ? ? ?Review of Systems ? ?Review of Systems  ?N/a  ?Physical Exam ? ?BP 122/78 (BP Location: Right Arm, Patient Position: Sitting, Cuff Size: Normal)   Pulse 84   Temp 98.8 ?F (37.1 ?C) (Oral)   Ht '5\' 7"'$  (1.702 m)   Wt 155 lb (70.3 kg)   SpO2 96%   BMI 24.28 kg/m?  ? ?Wt Readings from Last 5 Encounters:  ?03/16/22 155 lb (70.3 kg)  ?02/12/22 160 lb (72.6 kg)  ?02/05/22 157 lb (71.2 kg)  ?02/03/22 158 lb 6.4 oz (71.8 kg)  ?01/08/22 159 lb (72.1 kg)  ? ? ?BMI Readings from Last 5 Encounters:  ?03/16/22 24.28 kg/m?  ?02/12/22 25.06 kg/m?  ?02/05/22 24.59 kg/m?  ?02/03/22 24.81 kg/m?  ?01/08/22 24.90 kg/m?  ? ? ? ?Physical Exam ?General: Well-appearing, no distress ?Eyes: EOMI, icterus ?Neck: Supple, no JVP ?Cardiovascular: Regular rate and rhythm, no murmur ?Pulmonary: Clear, no work of breathing ?MSK: No synovitis, no joint effusion ?Neuro: Normal gait, no weakness ?Psych: Normal mood, full affect ? ?Assessment & Plan:  ? ?DOE: suspect post viral process given timing after covid May 2022.  Chest imaging clear.  Gradual improvement but still residual symptoms with exertion. Element of deconditioning likely.  PFTs  11/2021 consistent with significant bronchodilator response, asthma possible.  Did not improve with asthma treatment with ICS/LABA as well as S ABA.  Okay to stop these medications.  Referral for pulmonary rehab given

## 2022-03-16 NOTE — Telephone Encounter (Signed)
Pt states she was using '50mg'$  & adding '25mg'$  tabs; she is getting close to being out of '50mg'$  & wanted to know the plan.  ? ?Upon review of chart, weaning Sertraline discussed on 01/06/22: ?Adjustment disorder with other symptoms  ?-Stable ?-We will wean Zoloft 100 mg to Zoloft 75 mg daily. ?- Plan: sertraline (ZOLOFT) 25 MG tablet ? ?Sertraline '25mg'$  take 3 tablets ('75mg'$  total) by mouth daily was refilled on 03/11/22. Pt notified of above (she was not aware). States she will take '75mg'$  x 1 more month & wean to '50mg'$ . Pt advised to keep Dr Volanda Napoleon updated; verb understanding. ?  ?

## 2022-03-16 NOTE — Telephone Encounter (Signed)
Pt call and stated she need a new RX for Sertraline 75 mg to go along with the 25 and want a call back. ?

## 2022-03-19 ENCOUNTER — Telehealth (HOSPITAL_COMMUNITY): Payer: Self-pay

## 2022-03-19 NOTE — Telephone Encounter (Signed)
Pt insurance is active and benefits verified through [employer] A/[employer]. Co-pay $0.00, DED $226.00/$226.00 met, out of pocket $0.00/$0.00 met, co-insurance 20%. No pre-authorization required. 03/19/22 @ 11:44AM ?  ?2ndary insurance is active and benefits verified through [employer]. Co-pay $0.00, DED $0.00/$0.00 met, out of pocket $0.00/$0.00 met, co-insurance 0%. No pre-authorization required. 03/19/22 @ 11:44AM ?  ?Will contact patient to see if she is interested in the Pulmonary Rehab Program.  ?

## 2022-03-19 NOTE — Telephone Encounter (Signed)
Called patient to see if she is interested in the Pulmonary Rehab Program. Patient expressed interest. Explained scheduling process and went over insurance, patient verbalized understanding. Will contact patient for scheduling once Rn review. ?

## 2022-03-26 ENCOUNTER — Encounter (HOSPITAL_COMMUNITY): Payer: Self-pay | Admitting: *Deleted

## 2022-03-26 NOTE — Progress Notes (Signed)
Received referral from Dr. Silas Flood for this pt to participate in pulmonary rehab with the primary diagnosis of dyspnea on exertion and secondary diagnosis of COVID 19 virus infection. Pt with Covid 04/2021 with continued respiratory dysfunction symptoms of dyspnea on exertion greater than 4 weeks. This is well documented in progress notes with cardiology, neurology and PCP.   Clinical review of pt follow up appt on 03/16/22 Pulmonary office note.  Pt with Covid Risk Score - 4. Pt appropriate for scheduling for Pulmonary rehab.  Will forward to support staff for scheduling. Cherre Huger, BSN ?Cardiac and Pulmonary Rehab Nurse Navigator  ? ?

## 2022-03-30 DIAGNOSIS — R052 Subacute cough: Secondary | ICD-10-CM | POA: Diagnosis not present

## 2022-03-30 DIAGNOSIS — J300 Vasomotor rhinitis: Secondary | ICD-10-CM | POA: Diagnosis not present

## 2022-03-30 DIAGNOSIS — R0602 Shortness of breath: Secondary | ICD-10-CM | POA: Diagnosis not present

## 2022-03-31 DIAGNOSIS — H35373 Puckering of macula, bilateral: Secondary | ICD-10-CM | POA: Diagnosis not present

## 2022-04-12 ENCOUNTER — Encounter: Payer: Self-pay | Admitting: Family Medicine

## 2022-04-14 ENCOUNTER — Telehealth (HOSPITAL_COMMUNITY): Payer: Self-pay

## 2022-04-14 DIAGNOSIS — G245 Blepharospasm: Secondary | ICD-10-CM | POA: Diagnosis not present

## 2022-04-14 NOTE — Telephone Encounter (Signed)
Called and spoke with pt in regards to PR, pt stated she will be on vacation from 5/26-6/19. Adv pt to contact PR when she returns.  ?

## 2022-06-10 DIAGNOSIS — Z1231 Encounter for screening mammogram for malignant neoplasm of breast: Secondary | ICD-10-CM | POA: Diagnosis not present

## 2022-06-10 LAB — HM MAMMOGRAPHY

## 2022-06-14 ENCOUNTER — Encounter: Payer: Self-pay | Admitting: Family Medicine

## 2022-06-16 ENCOUNTER — Encounter: Payer: Self-pay | Admitting: Family Medicine

## 2022-06-17 ENCOUNTER — Telehealth (HOSPITAL_COMMUNITY): Payer: Self-pay | Admitting: *Deleted

## 2022-06-23 ENCOUNTER — Other Ambulatory Visit: Payer: Self-pay | Admitting: Family Medicine

## 2022-06-23 ENCOUNTER — Encounter (HOSPITAL_COMMUNITY)
Admission: RE | Admit: 2022-06-23 | Discharge: 2022-06-23 | Disposition: A | Discharge status: Home / Self Care | Payer: Medicare Other | Source: Ambulatory Visit | Attending: Pulmonary Disease | Admitting: Pulmonary Disease

## 2022-06-23 ENCOUNTER — Encounter (HOSPITAL_COMMUNITY): Payer: Self-pay

## 2022-06-23 VITALS — BP 150/80 | Ht 67.0 in | Wt 153.7 lb

## 2022-06-23 DIAGNOSIS — R0609 Other forms of dyspnea: Secondary | ICD-10-CM

## 2022-06-23 DIAGNOSIS — U071 COVID-19: Secondary | ICD-10-CM | POA: Diagnosis not present

## 2022-06-23 NOTE — Progress Notes (Signed)
Patricia Henry 71 y.o. female  Initial Psychosocial Assessment  Pt psychosocial assessment reveals pt lives alone. Pt is currently retired. Pt hobbies include painting. Pt reports her stress level is moderate. Areas of stress/anxiety include Family.  Pt does not exhibits signs of depression. Pt shows good  coping skills with positive outlook .  Offered emotional support and reassurance. Monitor and evaluate progress toward psychosocial goal of attending the PR program without any psychosocial concerns or issues.  Goal(s):  Help patient work toward returning to meaningful activities that improve and are attainable with patient's lung disease   06/23/2022 3:24 PM

## 2022-06-23 NOTE — Progress Notes (Addendum)
Pulmonary Individual Treatment Plan  Patient Details  Name: Patricia Henry MRN: [id number] Date of Birth: 1951/01/18 Referring Provider:   April Manson Pulmonary Rehab Walk Test from 06/23/2022 in [place]  Referring Provider Hansucker       Initial Encounter Date:  Flowsheet Row Pulmonary Rehab Walk Test from 06/23/2022 in [place]  Date 06/23/22       Visit Diagnosis: COVID-19 virus infection  Dyspnea on exertion  Patient's Home Medications on Admission:   Current Outpatient Medications:    azelastine (ASTELIN) 0.1 % nasal spray, Place into both nostrils 2 (two) times daily. Use in each nostril as directed, Disp: , Rfl:    clonazePAM (KLONOPIN) 0.5 MG tablet, Take 1 tablet (0.5 mg total) by mouth 2 (two) times daily as needed for anxiety., Disp: 60 tablet, Rfl: 0   clonazePAM (KLONOPIN) 0.5 MG tablet, , Disp: , Rfl:    Cyanocobalamin (B-12 PO), Take by mouth., Disp: , Rfl:    lisinopril (ZESTRIL) 5 MG tablet, Take 1 tablet (5 mg total) by mouth daily., Disp: 90 tablet, Rfl: 1   methylphenidate (RITALIN) 10 MG tablet, Take 10 mg by mouth as needed., Disp: , Rfl:    Nitric Acid LIQD, by Does not apply route., Disp: , Rfl:    Omega-3 1000 MG CAPS, Take by mouth., Disp: , Rfl:    OnabotulinumtoxinA (BOTOX IJ), Inject as directed. Inject into eyelids every 90 days, Disp: , Rfl:    Phenylephrine HCl (AFRIN ALLERGY NA), Place into the nose as needed. Diluted with ocean nasal spray, Disp: , Rfl:    vitamin C (ASCORBIC ACID) 500 MG tablet, Take 500 mg by mouth daily., Disp: , Rfl:    co-enzyme Q-10 30 MG capsule, Take 30 mg by mouth 3 (three) times daily. (Patient not taking: Reported on 06/23/2022), Disp: , Rfl:    loperamide (IMODIUM) 2 MG capsule, Take by mouth as needed for diarrhea or loose stools. (Patient not taking: Reported on 06/23/2022), Disp: , Rfl:    sertraline (ZOLOFT) 25 MG tablet, Take 3 tablets (75 mg total)  by mouth daily. (Patient not taking: Reported on 06/23/2022), Disp: 90 tablet, Rfl: 3  Past Medical History: Past Medical History:  Diagnosis Date   Benign essential blepharospasm    Fatigue    Hypertension    no meds   IBS (irritable bowel syndrome)    Migraines    SOB (shortness of breath)    UTI (urinary tract infection)     Tobacco Use: Social History   Tobacco Use  Smoking Status Former   Types: Cigarettes   Quit date: 05/23/2013   Years since quitting: 9.0  Smokeless Tobacco Never    Labs: Review Flowsheet       Latest Ref Rng & Units 10/23/2014 06/29/2021 11/04/2021 12/04/2021  Labs for ITP Cardiac and Pulmonary Rehab  LDL (calc) - - - - 215      HDL-C 35 - 70 - - - 90      Trlycerides 40 - 160 - - - 77      Hemoglobin A1c 4.8 - 5.6 % 5.3  5.0  5.2  -    Details       This result is from an external source.         Capillary Blood Glucose: No results found for: "GLUCAP"   Pulmonary Assessment Scores:  Pulmonary Assessment Scores     Row Name 06/23/22 1231 06/23/22 1516  ADL UCSD   ADL Phase Entry Entry    SOB Score total -- 24      CAT Score   CAT Score -- 7      mMRC Score   mMRC Score 1 --            UCSD: Self-administered rating of dyspnea associated with activities of daily living (ADLs) 6-point scale (0 = "not at all" to 5 = "maximal or unable to do because of breathlessness")  Scoring Scores range from 0 to 120.  Minimally important difference is 5 units  CAT: CAT can identify the health impairment of COPD patients and is better correlated with disease progression.  CAT has a scoring range of zero to 40. The CAT score is classified into four groups of low (less than 10), medium (10 - 20), high (21-30) and very high (31-40) based on the impact level of disease on health status. A CAT score over 10 suggests significant symptoms.  A worsening CAT score could be explained by an exacerbation, poor medication adherence, poor inhaler  technique, or progression of COPD or comorbid conditions.  CAT MCID is 2 points  mMRC: mMRC (Modified Medical Research Council) Dyspnea Scale is used to assess the degree of baseline functional disability in patients of respiratory disease due to dyspnea. No minimal important difference is established. A decrease in score of 1 point or greater is considered a positive change.   Pulmonary Function Assessment:   Exercise Target Goals: Exercise Program Goal: Individual exercise prescription set using results from initial 6 min walk test and THRR while considering  patient's activity barriers and safety.   Exercise Prescription Goal: Initial exercise prescription builds to 30-45 minutes a day of aerobic activity, 2-3 days per week.  Home exercise guidelines will be given to patient during program as part of exercise prescription that the participant will acknowledge.  Activity Barriers & Risk Stratification:  Activity Barriers & Cardiac Risk Stratification - 06/23/22 0959       Activity Barriers & Cardiac Risk Stratification   Activity Barriers Arthritis;Back Problems;Shortness of Breath    Cardiac Risk Stratification Low             6 Minute Walk:  6 Minute Walk     Row Name 06/23/22 1126         6 Minute Walk   Phase Initial     Distance 1923 feet     Walk Time 6 minutes     # of Rest Breaks 0     MPH 3.64     METS 4.49     RPE 7     Perceived Dyspnea  1     VO2 Peak 15.71     Symptoms No     Resting HR 69 bpm     Resting BP 150/80     Resting Oxygen Saturation  98 %     Exercise Oxygen Saturation  during 6 min walk 99 %     Max Ex. HR 118 bpm     Max Ex. BP 158/70     2 Minute Post BP 150/70       Interval HR   1 Minute HR 98     2 Minute HR 104     3 Minute HR 107     4 Minute HR 118     5 Minute HR 115     6 Minute HR 117     2 Minute Post HR 74  Interval Heart Rate? Yes       Interval Oxygen   Interval Oxygen? Yes     Baseline Oxygen  Saturation % 98 %     1 Minute Oxygen Saturation % 99 %     1 Minute Liters of Oxygen 0 L     2 Minute Oxygen Saturation % 99 %     2 Minute Liters of Oxygen 0 L     3 Minute Oxygen Saturation % 100 %     3 Minute Liters of Oxygen 0 L     4 Minute Oxygen Saturation % 100 %     4 Minute Liters of Oxygen 0 L     5 Minute Oxygen Saturation % 100 %     5 Minute Liters of Oxygen 0 L     6 Minute Oxygen Saturation % 99 %     6 Minute Liters of Oxygen 0 L     2 Minute Post Oxygen Saturation % 100 %     2 Minute Post Liters of Oxygen 0 L              Oxygen Initial Assessment:  Oxygen Initial Assessment - 06/23/22 1009       Home Oxygen   Home Oxygen Device None    Sleep Oxygen Prescription None    Home Exercise Oxygen Prescription None    Home Resting Oxygen Prescription None      Initial 6 min Walk   Oxygen Used None      Program Oxygen Prescription   Program Oxygen Prescription None      Intervention   Short Term Goals To learn and exhibit compliance with exercise, home and travel O2 prescription;To learn and understand importance of monitoring SPO2 with pulse oximeter and demonstrate accurate use of the pulse oximeter.;To learn and understand importance of maintaining oxygen saturations>88%;To learn and demonstrate proper pursed lip breathing techniques or other breathing techniques.     Long  Term Goals Exhibits compliance with exercise, home  and travel O2 prescription;Verbalizes importance of monitoring SPO2 with pulse oximeter and return demonstration;Maintenance of O2 saturations>88%;Exhibits proper breathing techniques, such as pursed lip breathing or other method taught during program session             Oxygen Re-Evaluation:   Oxygen Discharge (Final Oxygen Re-Evaluation):   Initial Exercise Prescription:  Initial Exercise Prescription - 06/23/22 1100       Date of Initial Exercise RX and Referring Provider   Date 06/23/22    Referring Provider  Hansucker    Expected Discharge Date 08/26/22      Treadmill   MPH 2.5    Grade 0    Minutes 15    METs 2.91      Bike   Level 2    Watts 30    Minutes 15      Prescription Details   Frequency (times per week) 2    Duration Progress to 30 minutes of continuous aerobic without signs/symptoms of physical distress      Intensity   THRR 40-80% of Max Heartrate 59-118    Ratings of Perceived Exertion 11-13    Perceived Dyspnea 0-4      Progression   Progression Continue to progress workloads to maintain intensity without signs/symptoms of physical distress.      Resistance Training   Training Prescription Yes    Weight blue bands    Reps 10-15  Perform Capillary Blood Glucose checks as needed.  Exercise Prescription Changes:   Exercise Comments:   Exercise Goals and Review:   Exercise Goals     Row Name 06/23/22 1128             Exercise Goals   Increase Physical Activity Yes       Intervention Provide advice, education, support and counseling about physical activity/exercise needs.;Develop an individualized exercise prescription for aerobic and resistive training based on initial evaluation findings, risk stratification, comorbidities and participant's personal goals.       Expected Outcomes Short Term: Attend rehab on a regular basis to increase amount of physical activity.;Long Term: Add in home exercise to make exercise part of routine and to increase amount of physical activity.;Long Term: Exercising regularly at least 3-5 days a week.       Increase Strength and Stamina Yes       Intervention Provide advice, education, support and counseling about physical activity/exercise needs.;Develop an individualized exercise prescription for aerobic and resistive training based on initial evaluation findings, risk stratification, comorbidities and participant's personal goals.       Expected Outcomes Short Term: Increase workloads from initial exercise  prescription for resistance, speed, and METs.;Short Term: Perform resistance training exercises routinely during rehab and add in resistance training at home;Long Term: Improve cardiorespiratory fitness, muscular endurance and strength as measured by increased METs and functional capacity (6MWT)       Able to understand and use rate of perceived exertion (RPE) scale Yes       Intervention Provide education and explanation on how to use RPE scale       Expected Outcomes Short Term: Able to use RPE daily in rehab to express subjective intensity level;Long Term:  Able to use RPE to guide intensity level when exercising independently       Able to understand and use Dyspnea scale Yes       Intervention Provide education and explanation on how to use Dyspnea scale       Expected Outcomes Short Term: Able to use Dyspnea scale daily in rehab to express subjective sense of shortness of breath during exertion;Long Term: Able to use Dyspnea scale to guide intensity level when exercising independently       Knowledge and understanding of Target Heart Rate Range (THRR) Yes       Intervention Provide education and explanation of THRR including how the numbers were predicted and where they are located for reference       Expected Outcomes Short Term: Able to state/look up THRR;Long Term: Able to use THRR to govern intensity when exercising independently;Short Term: Able to use daily as guideline for intensity in rehab       Understanding of Exercise Prescription Yes       Intervention Provide education, explanation, and written materials on patient's individual exercise prescription       Expected Outcomes Short Term: Able to explain program exercise prescription;Long Term: Able to explain home exercise prescription to exercise independently                Exercise Goals Re-Evaluation :   Discharge Exercise Prescription (Final Exercise Prescription Changes):   Nutrition:  Target Goals: Understanding of  nutrition guidelines, daily intake of sodium '1500mg'$ , cholesterol '200mg'$ , calories 30% from fat and 7% or less from saturated fats, daily to have 5 or more servings of fruits and vegetables.  Biometrics:  Pre Biometrics - 06/23/22 1002  Pre Biometrics   Grip Strength 26 kg   right hand             Nutrition Therapy Plan and Nutrition Goals:   Nutrition Assessments:  MEDIFICTS Score Key: ?70 Need to make dietary changes  40-70 Heart Healthy Diet ? 40 Therapeutic Level Cholesterol Diet   Picture Your Plate Scores: <38 Unhealthy dietary pattern with much room for improvement. 41-50 Dietary pattern unlikely to meet recommendations for good health and room for improvement. 51-60 More healthful dietary pattern, with some room for improvement.  >60 Healthy dietary pattern, although there may be some specific behaviors that could be improved.    Nutrition Goals Re-Evaluation:   Nutrition Goals Discharge (Final Nutrition Goals Re-Evaluation):   Psychosocial: Target Goals: Acknowledge presence or absence of significant depression and/or stress, maximize coping skills, provide positive support system. Participant is able to verbalize types and ability to use techniques and skills needed for reducing stress and depression.  Initial Review & Psychosocial Screening:  Initial Psych Review & Screening - 06/23/22 1208       Initial Review   Current issues with History of Depression;Current Sleep Concerns;None Identified   Has a long history of  difficult time getting to sleep denies that is is related to depression. She has depression since her husband passed. She was on Zoloft until a month ago and she decided to try without it. She took a year of grief counseling.     Family Dynamics   Good Support System? Yes      Barriers   Psychosocial barriers to participate in program There are no identifiable barriers or psychosocial needs.      Screening Interventions    Interventions Encouraged to exercise    Expected Outcomes Short Term goal: Utilizing psychosocial counselor, staff and physician to assist with identification of specific Stressors or current issues interfering with healing process. Setting desired goal for each stressor or current issue identified.;Long Term Goal: Stressors or current issues are controlled or eliminated.             Quality of Life Scores:  Scores of 19 and below usually indicate a poorer quality of life in these areas.  A difference of  2-3 points is a clinically meaningful difference.  A difference of 2-3 points in the total score of the Quality of Life Index has been associated with significant improvement in overall quality of life, self-image, physical symptoms, and general health in studies assessing change in quality of life.  PHQ-9: Review Flowsheet  More data may exist      06/23/2022 01/08/2022 08/27/2021 12/26/2020 08/26/2020  Depression screen PHQ 2/9  Decreased Interest 0 0 0 1 1 0  Down, Depressed, Hopeless 1 1 0 '2 1 1  '$ PHQ - 2 Score 1 1 0 '3 2 1  '$ Altered sleeping '2 2 1 3 '$ 0  Tired, decreased energy 0 '3 1 3 '$ 0  Change in appetite 0 0 1 1 0  Feeling bad or failure about yourself  0 0 1 0 0  Trouble concentrating 0 0 1 3 0  Moving slowly or fidgety/restless 0 0 1 2 0  Suicidal thoughts 0 0 1 0 0  PHQ-9 Score '3 5 10 14 1  '$ Difficult doing work/chores Not difficult at all - - - Somewhat difficult   Interpretation of Total Score  Total Score Depression Severity:  1-4 = Minimal depression, 5-9 = Mild depression, 10-14 = Moderate depression, 15-19 = Moderately severe depression, 20-27 =  Severe depression   Psychosocial Evaluation and Intervention:   Psychosocial Re-Evaluation:   Psychosocial Discharge (Final Psychosocial Re-Evaluation):   Education: Education Goals: Education classes will be provided on a weekly basis, covering required topics. Participant will state understanding/return demonstration of  topics presented.  Learning Barriers/Preferences:   Education Topics: Risk Factor Reduction:  -Group instruction that is supported by a PowerPoint presentation. Instructor discusses the definition of a risk factor, different risk factors for pulmonary disease, and how the heart and lungs work together.     Nutrition for Pulmonary Patient:  -Group instruction provided by PowerPoint slides, verbal discussion, and written materials to support subject matter. The instructor gives an explanation and review of healthy diet recommendations, which includes a discussion on weight management, recommendations for fruit and vegetable consumption, as well as protein, fluid, caffeine, fiber, sodium, sugar, and alcohol. Tips for eating when patients are short of breath are discussed.   Pursed Lip Breathing:  -Group instruction that is supported by demonstration and informational handouts. Instructor discusses the benefits of pursed lip and diaphragmatic breathing and detailed demonstration on how to preform both.     Oxygen Safety:  -Group instruction provided by PowerPoint, verbal discussion, and written material to support subject matter. There is an overview of "What is Oxygen" and "Why do we need it".  Instructor also reviews how to create a safe environment for oxygen use, the importance of using oxygen as prescribed, and the risks of noncompliance. There is a brief discussion on traveling with oxygen and resources the patient may utilize.   Oxygen Equipment:  -Group instruction provided by [facility] Staff utilizing handouts, written materials, and equipment demonstrations.   Signs and Symptoms:  -Group instruction provided by written material and verbal discussion to support subject matter. Warning signs and symptoms of infection, stroke, and heart attack are reviewed and when to call the physician/911 reinforced. Tips for preventing the spread of infection discussed.   Advanced Directives:   -Group instruction provided by verbal instruction and written material to support subject matter. Instructor reviews Advanced Directive laws and proper instruction for filling out document.   Pulmonary Video:  -Group video education that reviews the importance of medication and oxygen compliance, exercise, good nutrition, pulmonary hygiene, and pursed lip and diaphragmatic breathing for the pulmonary patient.   Exercise for the Pulmonary Patient:  -Group instruction that is supported by a PowerPoint presentation. Instructor discusses benefits of exercise, core components of exercise, frequency, duration, and intensity of an exercise routine, importance of utilizing pulse oximetry during exercise, safety while exercising, and options of places to exercise outside of rehab.     Pulmonary Medications:  -Verbally interactive group education provided by instructor with focus on inhaled medications and proper administration.   Anatomy and Physiology of the Respiratory System and Intimacy:  -Group instruction provided by PowerPoint, verbal discussion, and written material to support subject matter. Instructor reviews respiratory cycle and anatomical components of the respiratory system and their functions. Instructor also reviews differences in obstructive and restrictive respiratory diseases with examples of each. Intimacy, Sex, and Sexuality differences are reviewed with a discussion on how relationships can change when diagnosed with pulmonary disease. Common sexual concerns are reviewed.   MD DAY -A group question and answer session with a medical doctor that allows participants to ask questions that relate to their pulmonary disease state.   OTHER EDUCATION -Group or individual verbal, written, or video instructions that support the educational goals of the pulmonary rehab program.   [place] [facility]  Eating Survival Tips:  -Group instruction provided by PowerPoint slides, verbal discussion, and  written materials to support subject matter. The instructor gives patients tips, tricks, and techniques to help them not only survive but enjoy the holidays despite the onslaught of food that accompanies the holidays.   Knowledge Questionnaire Score:  Knowledge Questionnaire Score - 06/23/22 1516       Knowledge Questionnaire Score   Pre Score 12/18             Core Components/Risk Factors/Patient Goals at Admission:  Personal Goals and Risk Factors at Admission - 06/23/22 1531       Core Components/Risk Factors/Patient Goals on Admission   Improve shortness of breath with ADL's Yes    Intervention Provide education, individualized exercise plan and daily activity instruction to help decrease symptoms of SOB with activities of daily living.    Expected Outcomes Short Term: Improve cardiorespiratory fitness to achieve a reduction of symptoms when performing ADLs;Long Term: Be able to perform more ADLs without symptoms or delay the onset of symptoms    Intervention Provide education and demonstration as needed of appropriate use of medications, inhalers, and oxygen therapy.             Core Components/Risk Factors/Patient Goals Review:    Core Components/Risk Factors/Patient Goals at Discharge (Final Review):    ITP Comments:   Comments:Dr. Rodman Pickle is Medical Director for Pulmonary Rehab at [facility].

## 2022-06-23 NOTE — Progress Notes (Signed)
Patricia Henry 72 y.o. female Pulmonary Rehab Orientation Note This patient who was referred to Pulmonary Rehab by Dr. Silas Flood with the diagnosis of Covid 19 virus and Dyspnea on exertion arrived today in Cardiac and Pulmonary Rehab. She  arrived with ambulatory and normal gait. She  does not carry portable oxygen. Per patient, Patricia Henry uses oxygen never. Color good, skin warm and dry. Patient is oriented to time and place. Patient's medical history, psychosocial health, and medications reviewed. Psychosocial assessment reveals patient lives with alone. Patricia Henry is currently retired. Patient hobbies include  painting . Patient reports her stress level is moderate. Areas of stress/anxiety include family . Patient does not exhibit signs of depression. Signs of depression  difficulty falling asleep although she states that she has had this issue for years. PHQ2/9 score 1/3. Patricia Henry shows good  coping skills with positive outlook on life. Offered emotional support and reassurance. Will continue to monitor and evaluate progress toward psychosocial goal(s) of being able to attend the PR program without any psychosocial barriers. Physical assessment reveals heart rate is normal, breath sounds diminished throughout,  no wheezes, rales, or rhonchi heard.Grip strength equal, strong. Distal pulses present. Patricia Henry reports she does take medications as prescribed. Patient states she follows a regular  diet. The patient reports no specific efforts to gain or lose weight.. Pt's weight will be monitored closely. Demonstration and practice of PLB using pulse oximeter. Patricia Henry able to return demonstration satisfactorily. Safety and hand hygiene in the exercise area reviewed with patient. Patricia Henry voices understanding of the information reviewed. Department expectations discussed with patient and achievable goals were set. The patient shows enthusiasm about attending the program and we look forward to working with Patricia Henry. Patricia Henry completed a 6 min  walk test today and is scheduled to begin exercise on 7/11 at 1:15pm   [place]

## 2022-06-24 DIAGNOSIS — H1045 Other chronic allergic conjunctivitis: Secondary | ICD-10-CM | POA: Diagnosis not present

## 2022-06-28 ENCOUNTER — Telehealth (HOSPITAL_COMMUNITY): Payer: Self-pay | Admitting: Family Medicine

## 2022-06-28 ENCOUNTER — Telehealth (HOSPITAL_COMMUNITY): Payer: Self-pay

## 2022-06-28 NOTE — Telephone Encounter (Signed)
Returned call from [place]. She mentioned that she wanted to postpone Pulmonary Rehab until after she has a CT scan. I told her that this would mean she is being discharged from the program. I also discussed getting a new referral for Pulmonary Rehab from Dr. Silas Flood to be in scheduling wait list. She voiced understanding.

## 2022-06-29 ENCOUNTER — Ambulatory Visit (HOSPITAL_COMMUNITY): Payer: Medicare Other

## 2022-06-30 NOTE — Progress Notes (Signed)
Discharge Progress Report  Patient Details  Name: Patricia Henry MRN: [id number] Date of Birth: 12-May-1951 Referring Provider:   April [facility]on [facility] Walk Test from 06/23/2022 in [place]  Referring Provider Hansucker        Number of Visits: 0  Reason for Discharge:  Early Exit:  Patricia Henry has decided  that she did not want to start the PR program until she found out more about her condition. She has a scheduled CT scan and she does not want to start the program until she knows what she is dealing with. Patricia Henry wanted to be discharged.  Smoking History:  Social History   Tobacco Use  Smoking Status Former   Types: Cigarettes   Quit date: 05/23/2013   Years since quitting: 9.1  Smokeless Tobacco Never    Diagnosis:  COVID-19 virus infection  Dyspnea on exertion  ADL UCSD:  Pulmonary Assessment Scores     Row Name 06/23/22 1231 06/23/22 1516       ADL UCSD   ADL Phase Entry Entry    SOB Score total -- 24      CAT Score   CAT Score -- 7      mMRC Score   mMRC Score 1 --             Initial Exercise Prescription:  Initial Exercise Prescription - 06/23/22 1100       Date of Initial Exercise RX and Referring Provider   Date 06/23/22    Referring Provider Hansucker    Expected Discharge Date 08/26/22      Treadmill   MPH 2.5    Grade 0    Minutes 15    METs 2.91      Bike   Level 2    Watts 30    Minutes 15      Prescription Details   Frequency (times per week) 2    Duration Progress to 30 minutes of continuous aerobic without signs/symptoms of physical distress      Intensity   THRR 40-80% of Max Heartrate 59-118    Ratings of Perceived Exertion 11-13    Perceived Dyspnea 0-4      Progression   Progression Continue to progress workloads to maintain intensity without signs/symptoms of physical distress.      Resistance Training   Training Prescription Yes    Weight blue bands    Reps 10-15              Discharge Exercise Prescription (Final Exercise Prescription Changes):   Functional Capacity:  6 Minute Walk     Row Name 06/23/22 1126         6 Minute Walk   Phase Initial     Distance 1923 feet     Walk Time 6 minutes     # of Rest Breaks 0     MPH 3.64     METS 4.49     RPE 7     Perceived Dyspnea  1     VO2 Peak 15.71     Symptoms No     Resting HR 69 bpm     Resting BP 150/80     Resting Oxygen Saturation  98 %     Exercise Oxygen Saturation  during 6 min walk 99 %     Max Ex. HR 118 bpm     Max Ex. BP 158/70     2 Minute Post BP 150/70  Interval HR   1 Minute HR 98     2 Minute HR 104     3 Minute HR 107     4 Minute HR 118     5 Minute HR 115     6 Minute HR 117     2 Minute Post HR 74     Interval Heart Rate? Yes       Interval Oxygen   Interval Oxygen? Yes     Baseline Oxygen Saturation % 98 %     1 Minute Oxygen Saturation % 99 %     1 Minute Liters of Oxygen 0 L     2 Minute Oxygen Saturation % 99 %     2 Minute Liters of Oxygen 0 L     3 Minute Oxygen Saturation % 100 %     3 Minute Liters of Oxygen 0 L     4 Minute Oxygen Saturation % 100 %     4 Minute Liters of Oxygen 0 L     5 Minute Oxygen Saturation % 100 %     5 Minute Liters of Oxygen 0 L     6 Minute Oxygen Saturation % 99 %     6 Minute Liters of Oxygen 0 L     2 Minute Post Oxygen Saturation % 100 %     2 Minute Post Liters of Oxygen 0 L              Psychological, QOL, Others - Outcomes: PHQ 2/9:    06/23/2022    9:49 AM 06/23/2022    9:46 AM 01/08/2022    2:26 PM 08/27/2021    1:54 PM 12/26/2020    3:57 PM  Depression screen PHQ 2/9  Decreased Interest 0 0 0 1 1  Down, Depressed, Hopeless 1 1 0 2 1  PHQ - 2 Score 1 1 0 3 2  Altered sleeping '2  2 1 3  '$ Tired, decreased energy 0  '3 1 3  '$ Change in appetite 0  0 1 1  Feeling bad or failure about yourself  0  0 1 0  Trouble concentrating 0  0 1 3  Moving slowly or fidgety/restless 0  0 1 2  Suicidal thoughts  0  0 1 0  PHQ-9 Score '3  5 10 14  '$ Difficult doing work/chores Not difficult at all        Quality of Life:   Personal Goals: Goals established at orientation with interventions provided to work toward goal.  Personal Goals and Risk Factors at Admission - 06/23/22 1531       Core Components/Risk Factors/Patient Goals on Admission   Improve shortness of breath with ADL's Yes    Intervention Provide education, individualized exercise plan and daily activity instruction to help decrease symptoms of SOB with activities of daily living.    Expected Outcomes Short Term: Improve cardiorespiratory fitness to achieve a reduction of symptoms when performing ADLs;Long Term: Be able to perform more ADLs without symptoms or delay the onset of symptoms    Intervention Provide education and demonstration as needed of appropriate use of medications, inhalers, and oxygen therapy.              Personal Goals Discharge:   Exercise Goals and Review:  Exercise Goals     Row Name 06/23/22 1128             Exercise Goals   Increase Physical Activity Yes  Intervention Provide advice, education, support and counseling about physical activity/exercise needs.;Develop an individualized exercise prescription for aerobic and resistive training based on initial evaluation findings, risk stratification, comorbidities and participant's personal goals.       Expected Outcomes Short Term: Attend rehab on a regular basis to increase amount of physical activity.;Long Term: Add in home exercise to make exercise part of routine and to increase amount of physical activity.;Long Term: Exercising regularly at least 3-5 days a week.       Increase Strength and Stamina Yes       Intervention Provide advice, education, support and counseling about physical activity/exercise needs.;Develop an individualized exercise prescription for aerobic and resistive training based on initial evaluation findings, risk  stratification, comorbidities and participant's personal goals.       Expected Outcomes Short Term: Increase workloads from initial exercise prescription for resistance, speed, and METs.;Short Term: Perform resistance training exercises routinely during rehab and add in resistance training at home;Long Term: Improve cardiorespiratory fitness, muscular endurance and strength as measured by increased METs and functional capacity (6MWT)       Able to understand and use rate of perceived exertion (RPE) scale Yes       Intervention Provide education and explanation on how to use RPE scale       Expected Outcomes Short Term: Able to use RPE daily in rehab to express subjective intensity level;Long Term:  Able to use RPE to guide intensity level when exercising independently       Able to understand and use Dyspnea scale Yes       Intervention Provide education and explanation on how to use Dyspnea scale       Expected Outcomes Short Term: Able to use Dyspnea scale daily in rehab to express subjective sense of shortness of breath during exertion;Long Term: Able to use Dyspnea scale to guide intensity level when exercising independently       Knowledge and understanding of Target Heart Rate Range (THRR) Yes       Intervention Provide education and explanation of THRR including how the numbers were predicted and where they are located for reference       Expected Outcomes Short Term: Able to state/look up THRR;Long Term: Able to use THRR to govern intensity when exercising independently;Short Term: Able to use daily as guideline for intensity in rehab       Understanding of Exercise Prescription Yes       Intervention Provide education, explanation, and written materials on patient's individual exercise prescription       Expected Outcomes Short Term: Able to explain program exercise prescription;Long Term: Able to explain home exercise prescription to exercise independently                Exercise Goals  Re-Evaluation:   Nutrition & Weight - Outcomes:  Pre Biometrics - 06/23/22 1002       Pre Biometrics   Grip Strength 26 kg   right hand             Nutrition:   Nutrition Discharge:   Education Questionnaire Score:  Knowledge Questionnaire Score - 06/23/22 1516       Knowledge Questionnaire Score   Pre Score 12/18             Goals reviewed with patient; copy given to patient.

## 2022-07-01 ENCOUNTER — Ambulatory Visit: Payer: Medicare Other | Admitting: Pulmonary Disease

## 2022-07-01 ENCOUNTER — Ambulatory Visit (HOSPITAL_COMMUNITY): Payer: Medicare Other

## 2022-07-05 ENCOUNTER — Ambulatory Visit
Admission: RE | Admit: 2022-07-05 | Discharge: 2022-07-05 | Disposition: A | Discharge status: Home / Self Care | Payer: Medicare Other | Source: Ambulatory Visit | Attending: Family Medicine | Admitting: Family Medicine

## 2022-07-05 DIAGNOSIS — R0609 Other forms of dyspnea: Secondary | ICD-10-CM

## 2022-07-05 DIAGNOSIS — R0600 Dyspnea, unspecified: Secondary | ICD-10-CM | POA: Diagnosis not present

## 2022-07-06 ENCOUNTER — Ambulatory Visit (HOSPITAL_COMMUNITY): Payer: Medicare Other

## 2022-07-06 ENCOUNTER — Other Ambulatory Visit: Payer: Self-pay | Admitting: Family Medicine

## 2022-07-06 DIAGNOSIS — R918 Other nonspecific abnormal finding of lung field: Secondary | ICD-10-CM

## 2022-07-06 DIAGNOSIS — R911 Solitary pulmonary nodule: Secondary | ICD-10-CM

## 2022-07-08 ENCOUNTER — Ambulatory Visit (HOSPITAL_COMMUNITY): Payer: Medicare Other

## 2022-07-13 ENCOUNTER — Ambulatory Visit (HOSPITAL_COMMUNITY): Payer: Medicare Other

## 2022-07-13 IMAGING — DX DG CHEST 2V
2 series · 2 of 2 positions shown · non-contrast
Comparison: None

CLINICAL DATA: Shortness of breath for 2 months, history of long
COVID

EXAM:
CHEST - 2 VIEW

[chest pa]
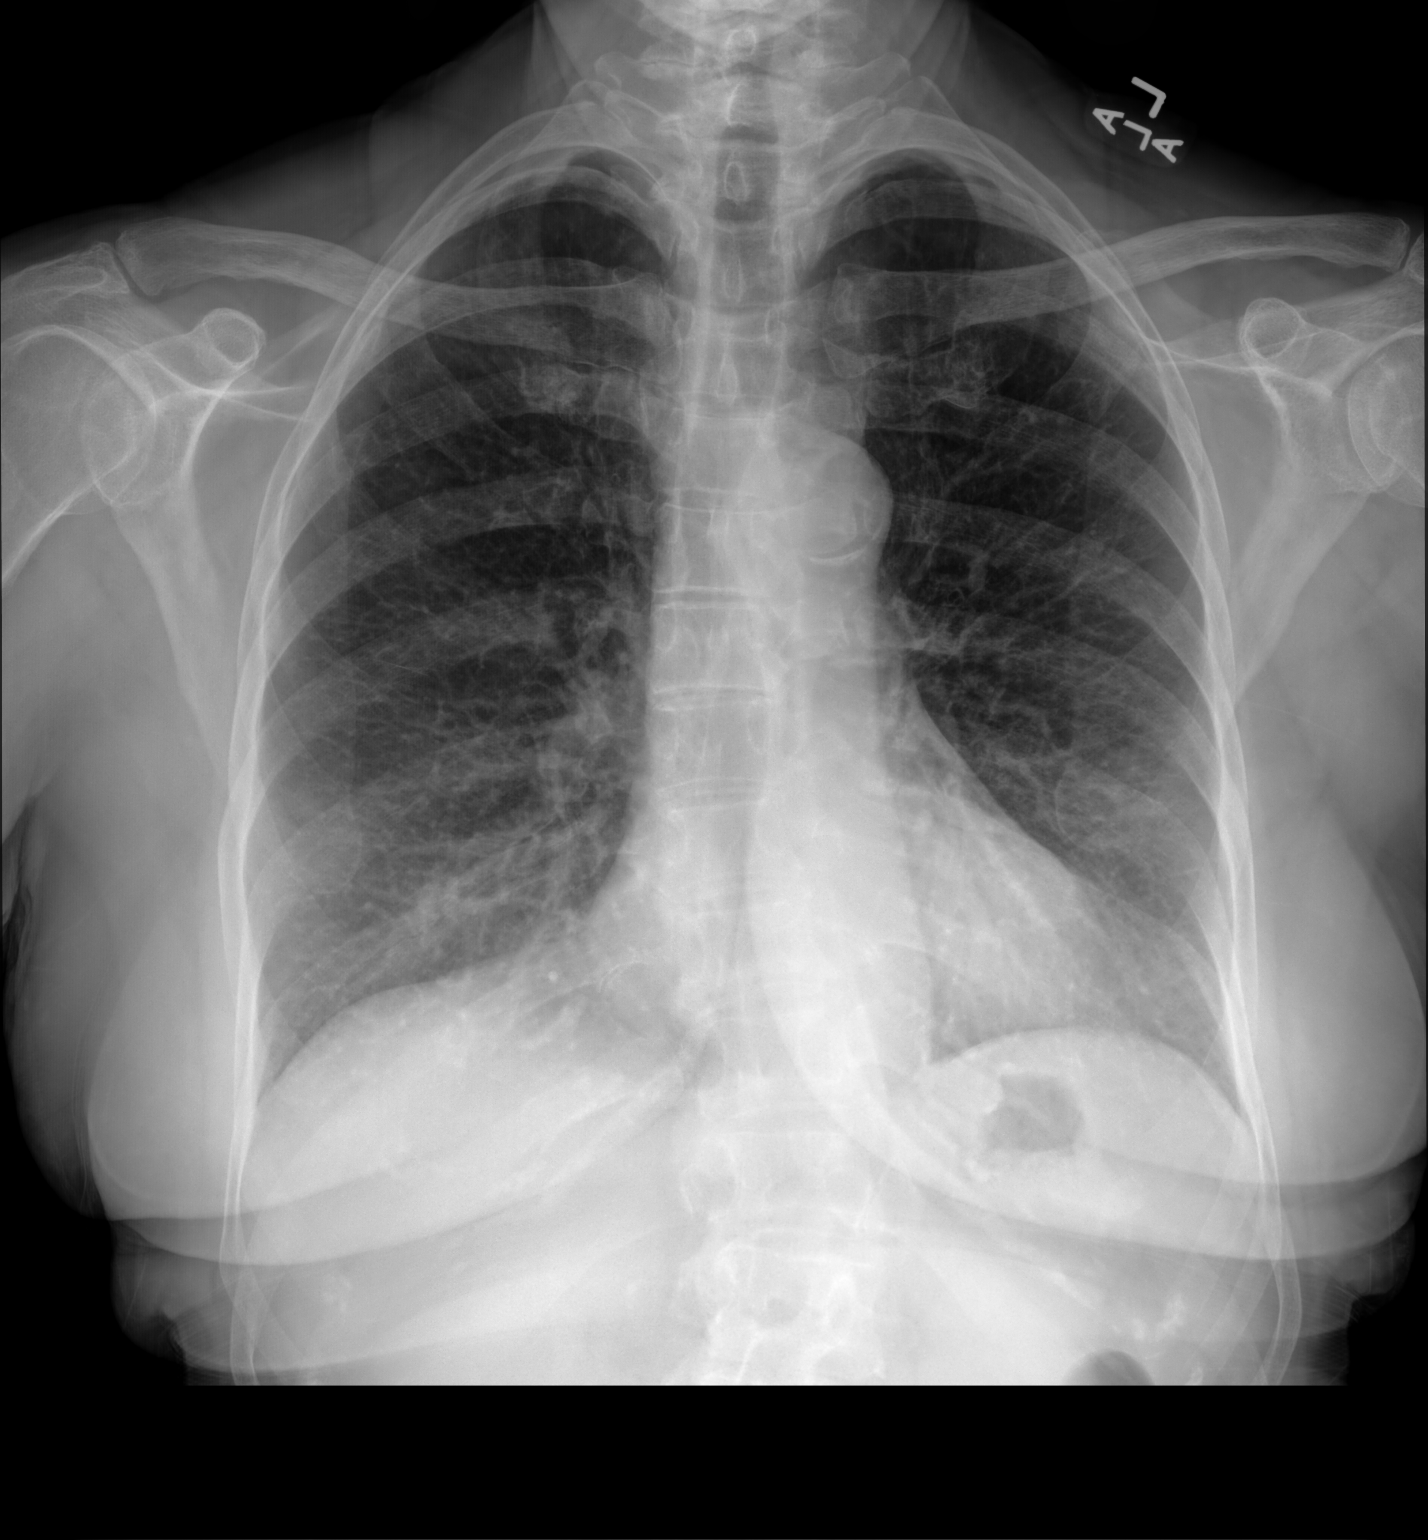

[chest lat]
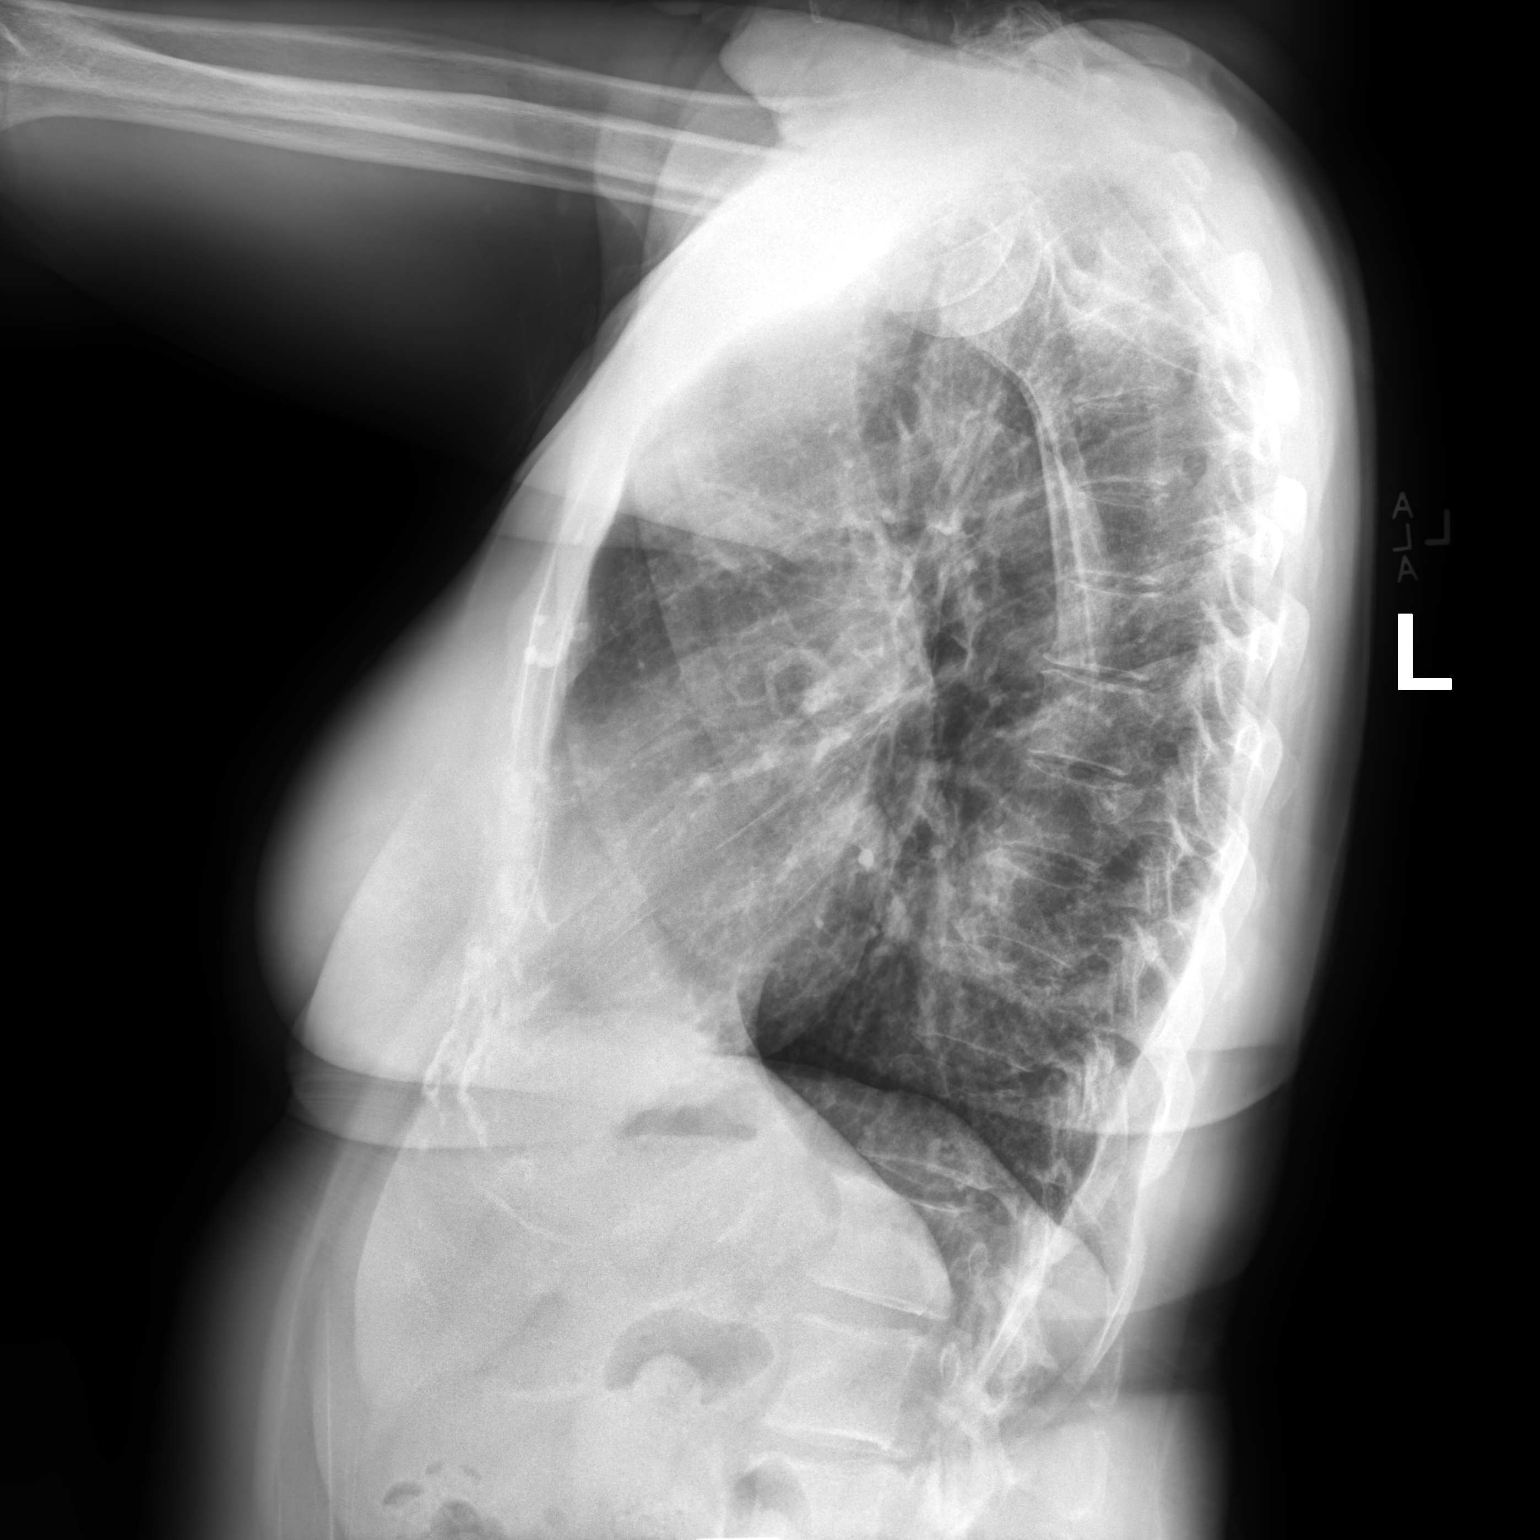

[2 of 2 positions shown; findings below may reference images not displayed]

FINDINGS: Normal heart size, mediastinal contours, and pulmonary vascularity.

Atherosclerotic calcification aorta.

Lungs clear.

No pulmonary infiltrate, pleural effusion, or pneumothorax.

Levoconvex thoracolumbar scoliosis.
IMPRESSION: No acute abnormalities.

Aortic Atherosclerosis ([id number]).

## 2022-07-14 ENCOUNTER — Other Ambulatory Visit: Payer: Medicare Other

## 2022-07-14 DIAGNOSIS — G245 Blepharospasm: Secondary | ICD-10-CM | POA: Diagnosis not present

## 2022-07-15 ENCOUNTER — Ambulatory Visit (HOSPITAL_COMMUNITY): Payer: Medicare Other

## 2022-07-15 DIAGNOSIS — L570 Actinic keratosis: Secondary | ICD-10-CM | POA: Diagnosis not present

## 2022-07-15 DIAGNOSIS — L918 Other hypertrophic disorders of the skin: Secondary | ICD-10-CM | POA: Diagnosis not present

## 2022-07-15 DIAGNOSIS — D2272 Melanocytic nevi of left lower limb, including hip: Secondary | ICD-10-CM | POA: Diagnosis not present

## 2022-07-15 DIAGNOSIS — Z85828 Personal history of other malignant neoplasm of skin: Secondary | ICD-10-CM | POA: Diagnosis not present

## 2022-07-15 DIAGNOSIS — D225 Melanocytic nevi of trunk: Secondary | ICD-10-CM | POA: Diagnosis not present

## 2022-07-15 DIAGNOSIS — L814 Other melanin hyperpigmentation: Secondary | ICD-10-CM | POA: Diagnosis not present

## 2022-07-15 DIAGNOSIS — D1801 Hemangioma of skin and subcutaneous tissue: Secondary | ICD-10-CM | POA: Diagnosis not present

## 2022-07-15 DIAGNOSIS — L821 Other seborrheic keratosis: Secondary | ICD-10-CM | POA: Diagnosis not present

## 2022-07-20 ENCOUNTER — Ambulatory Visit (HOSPITAL_COMMUNITY): Payer: Medicare Other

## 2022-07-22 ENCOUNTER — Ambulatory Visit (HOSPITAL_COMMUNITY): Payer: Medicare Other

## 2022-07-27 ENCOUNTER — Other Ambulatory Visit: Payer: Self-pay | Admitting: Family Medicine

## 2022-07-27 ENCOUNTER — Ambulatory Visit (HOSPITAL_COMMUNITY): Payer: Medicare Other

## 2022-07-27 DIAGNOSIS — I10 Essential (primary) hypertension: Secondary | ICD-10-CM

## 2022-08-10 ENCOUNTER — Ambulatory Visit (HOSPITAL_COMMUNITY): Payer: Medicare Other

## 2022-08-12 ENCOUNTER — Ambulatory Visit (HOSPITAL_COMMUNITY): Payer: Medicare Other

## 2022-08-17 ENCOUNTER — Ambulatory Visit (HOSPITAL_COMMUNITY): Payer: Medicare Other

## 2022-08-19 ENCOUNTER — Ambulatory Visit (HOSPITAL_COMMUNITY): Payer: Medicare Other

## 2022-08-24 ENCOUNTER — Ambulatory Visit (HOSPITAL_COMMUNITY): Payer: Medicare Other

## 2022-08-26 ENCOUNTER — Ambulatory Visit (HOSPITAL_COMMUNITY): Payer: Medicare Other

## 2022-08-26 ENCOUNTER — Telehealth: Payer: Self-pay | Admitting: Family Medicine

## 2022-08-26 NOTE — Telephone Encounter (Signed)
Left message for patient to call back and schedule Medicare Annual Wellness Visit (AWV) either virtually or in office. Left  my Herbie Drape number [phone]   Last AWV ;08/27/21  please schedule at anytime with [facility] Nurse Health Advisor 1 or 2

## 2022-08-27 NOTE — Telephone Encounter (Signed)
Returned patients call   Patient returned my call 

## 2022-08-30 ENCOUNTER — Ambulatory Visit: Payer: Medicare Other | Admitting: Pulmonary Disease

## 2022-09-09 ENCOUNTER — Ambulatory Visit (INDEPENDENT_AMBULATORY_CARE_PROVIDER_SITE_OTHER): Payer: Medicare Other | Admitting: Pulmonary Disease

## 2022-09-09 ENCOUNTER — Encounter: Payer: Self-pay | Admitting: Pulmonary Disease

## 2022-09-09 VITALS — BP 122/64 | HR 44 | Ht 67.0 in | Wt 147.6 lb

## 2022-09-09 DIAGNOSIS — R0609 Other forms of dyspnea: Secondary | ICD-10-CM | POA: Diagnosis not present

## 2022-09-09 NOTE — Progress Notes (Signed)
$'@Patient'M$  ID: Patricia Henry, female    DOB: 1951-11-05, 71 y.o.   MRN: [id number]  Chief Complaint  Patient presents with   Follow-up    Pt is here for follow up for DOE. Pt states that she is doing well and no changes noted.     Referring provider: Billie Ruddy, MD  HPI:   71 y.o. whom we are seeing in follow up for evaluation of dyspnea on exertion.    Returns to clinic for scheduled follow-up.  Still dyspneic when going up stairs, hills, exercising.  Overall otherwise doing okay.  Symptoms unchanged.  Went to orientation pulmonary rehab.  Felt like the degree of exercise would be to low given her baseline activity level.  So she did not go through with this.  She has CT scan ordered by another provider.  This showed scattered nodules with repeat CT scan already planned and ordered.  HPI at initial visit: Patient was in normal state of health.  Developed COVID May 2022.  Initial symptoms improved.  Had lingering dyspnea on exertion.  Worse with stairs and inclines.  Persistence of lingered for several months.  Gradual improvement but still present.  No time of day to things are better or worse.  No position makes it better or worse.  No seasonal environmental factors he can identify that make things better or worse.  No other relieving or exacerbating factors.  Recently seen by PCP with chest x-ray 10/15/2021 on my review and interpretation with clear lungs bilaterally.  PMH: Anxiety Surgical history: Eye surgery, oophorectomy Family history: Mother with Alzheimer's, heart failure, father with CAD Social history: Former smoker, quit 2014, lives in [place] / Pulmonary Flowsheets:   ACT:      No data to display          MMRC: mMRC Dyspnea Scale mMRC Score  12/16/2021 10:48 AM 1    Epworth:      No data to display          Tests:   FENO:  No results found for: "NITRICOXIDE"  PFT:    Latest Ref Rng & Units 12/16/2021    9:51 AM  PFT  Results  FVC-Pre L 2.60   FVC-Predicted Pre % 77   FVC-Post L 2.88   FVC-Predicted Post % 86   Pre FEV1/FVC % % 73   Post FEV1/FCV % % 77   FEV1-Pre L 1.90   FEV1-Predicted Pre % 75   FEV1-Post L 2.20   DLCO uncorrected ml/min/mmHg 18.41   DLCO UNC% % 85   DLCO corrected ml/min/mmHg 18.36   DLCO COR %Predicted % 85   DLVA Predicted % 92   TLC L 5.38   TLC % Predicted % 97   RV % Predicted % 111   Personally reviewed interpreted as: Spirometry normal with significant bronchodilator response with 300 cc or 15% improvement in FEV1 after albuterol.  Lung volumes normal, DLCO normal.   WALK:     06/23/2022   11:26 AM  SIX MIN WALK  2 Minute Oxygen Saturation % 99 %  2 Minute HR 104  4 Minute Oxygen Saturation % 100 %  4 Minute HR 118  6 Minute Oxygen Saturation % 99 %  6 Minute HR 117    Imaging: Personally reviewed as per EMR discussion this note  Lab Results: Personally reviewed CBC    Component Value Date/Time   WBC 5.5 01/08/2022 1453   RBC 4.62 01/08/2022 1453  HGB 13.7 01/08/2022 1453   HGB 13.5 11/04/2021 1024   HCT 41.6 01/08/2022 1453   HCT 40.6 11/04/2021 1024   PLT 190.0 01/08/2022 1453   PLT 177 11/04/2021 1024   MCV 90.2 01/08/2022 1453   MCV 91 11/04/2021 1024   MCH 30.1 11/04/2021 1024   MCH 30.2 10/01/2020 1248   MCHC 33.0 01/08/2022 1453   RDW 13.2 01/08/2022 1453   RDW 13.1 11/04/2021 1024   LYMPHSABS 1.4 01/08/2022 1453   LYMPHSABS 1.0 11/04/2021 1024   MONOABS 0.4 01/08/2022 1453   EOSABS 0.1 01/08/2022 1453   EOSABS 0.1 11/04/2021 1024   BASOSABS 0.0 01/08/2022 1453   BASOSABS 0.0 11/04/2021 1024    BMET    Component Value Date/Time   NA 141 01/08/2022 1453   K 4.6 01/08/2022 1453   CL 104 01/08/2022 1453   CO2 32 01/08/2022 1453   GLUCOSE 86 01/08/2022 1453   BUN 22 01/08/2022 1453   CREATININE 0.76 01/08/2022 1453   CREATININE 0.83 10/01/2020 1248   CALCIUM 10.0 01/08/2022 1453   GFRNONAA 71 10/01/2020 1248   GFRAA 83  10/01/2020 1248    BNP No results found for: "BNP"  ProBNP    Component Value Date/Time   PROBNP 64.0 10/15/2021 0911    Specialty Problems   None   No Known Allergies  Immunization History  Administered Date(s) Administered   Fluad Quad(high Dose 65+) 10/25/2019, 12/09/2020   Influenza, High Dose Seasonal PF 02/03/2018, 10/12/2018, 10/25/2019, 12/09/2020, 03/30/2022   Influenza-Unspecified 09/22/2021   PFIZER Comirnaty(Gray Top)Covid-19 Tri-Sucrose Vaccine 01/14/2020, 02/04/2020, 08/20/2020, 03/30/2021, 03/30/2022   PFIZER(Purple Top)SARS-COV-2 Vaccination 01/14/2020, 02/04/2020, 08/20/2020, 03/30/2021, 03/30/2022   Pfizer Covid-19 Vaccine Bivalent Booster 72yr & up 10/13/2021   Pneumococcal Conjugate-13 02/11/2016   Pneumococcal Polysaccharide-23 08/20/2020, 03/30/2022   Tdap 11/23/2020   Zoster Recombinat (Shingrix) 01/20/2022, 06/10/2022    Past Medical History:  Diagnosis Date   Benign essential blepharospasm    Fatigue    Hypertension    no meds   IBS (irritable bowel syndrome)    Migraines    SOB (shortness of breath)    UTI (urinary tract infection)     Tobacco History: Social History   Tobacco Use  Smoking Status Former   Types: Cigarettes   Quit date: 05/23/2013   Years since quitting: 9.3  Smokeless Tobacco Never   Counseling given: Not Answered   Continue to not smoke  Outpatient Encounter Medications as of 09/09/2022  Medication Sig   azelastine (ASTELIN) 0.1 % nasal spray Place into both nostrils 2 (two) times daily. Use in each nostril as directed   clonazePAM (KLONOPIN) 0.5 MG tablet Take 1 tablet (0.5 mg total) by mouth 2 (two) times daily as needed for anxiety.   clonazePAM (KLONOPIN) 0.5 MG tablet    co-enzyme Q-10 30 MG capsule Take 30 mg by mouth 3 (three) times daily.   Cyanocobalamin (B-12 PO) Take by mouth.   lisinopril (ZESTRIL) 5 MG tablet TAKE 1 TABLET (5 MG TOTAL) BY MOUTH DAILY.   loperamide (IMODIUM) 2 MG capsule Take by  mouth as needed for diarrhea or loose stools.   methylphenidate (RITALIN) 10 MG tablet Take 10 mg by mouth as needed.   Omega-3 1000 MG CAPS Take by mouth.   OnabotulinumtoxinA (BOTOX IJ) Inject as directed. Inject into eyelids every 90 days   Phenylephrine HCl (AFRIN ALLERGY NA) Place into the nose as needed. Diluted with ocean nasal spray   sertraline (ZOLOFT) 25 MG tablet Take 3  tablets (75 mg total) by mouth daily.   vitamin C (ASCORBIC ACID) 500 MG tablet Take 500 mg by mouth daily.   [DISCONTINUED] Nitric Acid LIQD by Does not apply route.   No facility-administered encounter medications on file as of 09/09/2022.     Review of Systems N/a   Physical Exam  BP 122/64 (BP Location: Left Arm, Patient Position: Sitting, Cuff Size: Normal)   Pulse (!) 44   Ht '5\' 7"'$  (1.702 m)   Wt 147 lb 9.6 oz (67 kg)   SpO2 97%   BMI 23.12 kg/m   Wt Readings from Last 5 Encounters:  09/09/22 147 lb 9.6 oz (67 kg)  06/23/22 153 lb 10.6 oz (69.7 kg)  03/16/22 155 lb (70.3 kg)  02/12/22 160 lb (72.6 kg)  02/05/22 157 lb (71.2 kg)    BMI Readings from Last 5 Encounters:  09/09/22 23.12 kg/m  06/23/22 24.07 kg/m  03/16/22 24.28 kg/m  02/12/22 25.06 kg/m  02/05/22 24.59 kg/m     Physical Exam General: Well-appearing, no distress Eyes: EOMI, icterus Neck: Supple, no JVP Cardiovascular: Regular rate and rhythm, no murmur Pulmonary: Clear, no work of breathing MSK: No synovitis, no joint effusion Neuro: Normal gait, no weakness Psych: Normal mood, full affect  Assessment & Plan:   DOE: suspect post viral process given timing after covid May 2022.  Chest imaging clear.  Gradual improvement but still residual symptoms with exertion. Element of deconditioning likely.  PFTs 11/2021 consistent with significant bronchodilator response, asthma possible.  Did not improve with asthma treatment with ICS/LABA as well as S ABA.  Referred to pulmonary rehab but this was not completed due to her  feeling that it would be not strenuous enough based on her baseline level of activity.  Cardiac work-up has been reassuring.  She questions possibility of anxiety contributing to symptoms.  CPET with arterial blood gas pre and post, will refer to [facility] to have this done.  Asthma: Clinical diagnosis based on bronchodilator spots, worsening dyspnea exertion following viral infection.  Did not improve with initial asthma symptoms.  Possible that this is the wrong diagnosis.  Addressing dyspnea on exertion as above.  Lung nodules: Groundglass, multiple.  Ongoing evaluation by another provider.  Happy to assist in any way if needed.  Return if symptoms worsen or fail to improve.   Lanier Clam, MD 09/09/2022

## 2022-09-09 NOTE — Patient Instructions (Signed)
Nice to see you again  I put in the order for a cardiopulmonary exercise test.  I think a blood gas before and after the test would be helpful in teasing out a diagnosis.  Sometimes even when doing this we do not arrive the diagnosis but I think this is the next best step.  Since I would like to be done with a blood gas, I am going to send the order to [facility].  It should be performed in [facility].  Hopefully someone will call you in the next week to get that scheduled.  I will look for the result.  Please let me know once the test has been performed.  Return to clinic based on results of cardiopulmonary exercise test

## 2022-09-20 ENCOUNTER — Ambulatory Visit (INDEPENDENT_AMBULATORY_CARE_PROVIDER_SITE_OTHER): Payer: Medicare Other

## 2022-09-20 VITALS — Ht 67.0 in | Wt 144.0 lb

## 2022-09-20 DIAGNOSIS — Z0000 Encounter for general adult medical examination without abnormal findings: Secondary | ICD-10-CM

## 2022-09-20 NOTE — Patient Instructions (Signed)
[name] , Thank you for taking time to come for your Medicare Wellness Visit. I appreciate your ongoing commitment to your health goals. Please review the following plan we discussed and let me know if I can assist you in the future.   Screening recommendations/referrals: Colonoscopy: completed within 2-3 years Mammogram: completed 06/10/2022 Bone Density: completed 01/20/2022 Recommended yearly ophthalmology/optometry visit for glaucoma screening and checkup Recommended yearly dental visit for hygiene and checkup  Vaccinations: Influenza vaccine: due Pneumococcal vaccine: completed 03/30/2022 Tdap vaccine: completed 11/23/2020, due 11/23/2030 Shingles vaccine: completed   Covid-19: 03/30/2022, 10/13/2021, 03/30/2021, 08/20/2020, 02/04/2020, 01/14/2020  Advanced directives: Please bring a copy of your POA (Power of Attorney) and/or Living Will to your next appointment.   Conditions/risks identified: none  Next appointment: Follow up in one year for your annual wellness visit    Preventive Care 65 Years and Older, Female Preventive care refers to lifestyle choices and visits with your health care provider that can promote health and wellness. What does preventive care include? A yearly physical exam. This is also called an annual well check. Dental exams once or twice a year. Routine eye exams. Ask your health care provider how often you should have your eyes checked. Personal lifestyle choices, including: Daily care of your teeth and gums. Regular physical activity. Eating a healthy diet. Avoiding tobacco and drug use. Limiting alcohol use. Practicing safe sex. Taking low-dose aspirin every day. Taking vitamin and mineral supplements as recommended by your health care provider. What happens during an annual well check? The services and screenings done by your health care provider during your annual well check will depend on your age, overall health, lifestyle risk factors, and family  history of disease. Counseling  Your health care provider may ask you questions about your: Alcohol use. Tobacco use. Drug use. Emotional well-being. Home and relationship well-being. Sexual activity. Eating habits. History of falls. Memory and ability to understand (cognition). Work and work Statistician. Reproductive health. Screening  You may have the following tests or measurements: Height, weight, and BMI. Blood pressure. Lipid and cholesterol levels. These may be checked every 5 years, or more frequently if you are over 61 years old. Skin check. Lung cancer screening. You may have this screening every year starting at age 85 if you have a 30-pack-year history of smoking and currently smoke or have quit within the past 15 years. Fecal occult blood test (FOBT) of the stool. You may have this test every year starting at age 87. Flexible sigmoidoscopy or colonoscopy. You may have a sigmoidoscopy every 5 years or a colonoscopy every 10 years starting at age 62. Hepatitis C blood test. Hepatitis B blood test. Sexually transmitted disease (STD) testing. Diabetes screening. This is done by checking your blood sugar (glucose) after you have not eaten for a while (fasting). You may have this done every 1-3 years. Bone density scan. This is done to screen for osteoporosis. You may have this done starting at age 87. Mammogram. This may be done every 1-2 years. Talk to your health care provider about how often you should have regular mammograms. Talk with your health care provider about your test results, treatment options, and if necessary, the need for more tests. Vaccines  Your health care provider may recommend certain vaccines, such as: Influenza vaccine. This is recommended every year. Tetanus, diphtheria, and acellular pertussis (Tdap, Td) vaccine. You may need a Td booster every 10 years. Zoster vaccine. You may need this after age 31. Pneumococcal 13-valent conjugate (  PCV13)  vaccine. One dose is recommended after age 70. Pneumococcal polysaccharide (PPSV23) vaccine. One dose is recommended after age 45. Talk to your health care provider about which screenings and vaccines you need and how often you need them. This information is not intended to replace advice given to you by your health care provider. Make sure you discuss any questions you have with your health care provider. Document Released: 01/02/2016 Document Revised: 08/25/2016 Document Reviewed: 10/07/2015 Elsevier Interactive Patient Education  2017 [place] Prevention in the Home Falls can cause injuries. They can happen to people of all ages. There are many things you can do to make your home safe and to help prevent falls. What can I do on the outside of my home? Regularly fix the edges of walkways and driveways and fix any cracks. Remove anything that might make you trip as you walk through a door, such as a raised step or threshold. Trim any bushes or trees on the path to your home. Use bright outdoor lighting. Clear any walking paths of anything that might make someone trip, such as rocks or tools. Regularly check to see if handrails are loose or broken. Make sure that both sides of any steps have handrails. Any raised decks and porches should have guardrails on the edges. Have any leaves, snow, or ice cleared regularly. Use sand or salt on walking paths during winter. Clean up any spills in your garage right away. This includes oil or grease spills. What can I do in the bathroom? Use night lights. Install grab bars by the toilet and in the tub and shower. Do not use towel bars as grab bars. Use non-skid mats or decals in the tub or shower. If you need to sit down in the shower, use a plastic, non-slip stool. Keep the floor dry. Clean up any water that spills on the floor as soon as it happens. Remove soap buildup in the tub or shower regularly. Attach bath mats securely with  double-sided non-slip rug tape. Do not have throw rugs and other things on the floor that can make you trip. What can I do in the bedroom? Use night lights. Make sure that you have a light by your bed that is easy to reach. Do not use any sheets or blankets that are too big for your bed. They should not hang down onto the floor. Have a firm chair that has side arms. You can use this for support while you get dressed. Do not have throw rugs and other things on the floor that can make you trip. What can I do in the kitchen? Clean up any spills right away. Avoid walking on wet floors. Keep items that you use a lot in easy-to-reach places. If you need to reach something above you, use a strong step stool that has a grab bar. Keep electrical cords out of the way. Do not use floor polish or wax that makes floors slippery. If you must use wax, use non-skid floor wax. Do not have throw rugs and other things on the floor that can make you trip. What can I do with my stairs? Do not leave any items on the stairs. Make sure that there are handrails on both sides of the stairs and use them. Fix handrails that are broken or loose. Make sure that handrails are as long as the stairways. Check any carpeting to make sure that it is firmly attached to the stairs. Fix any carpet that is  loose or worn. Avoid having throw rugs at the top or bottom of the stairs. If you do have throw rugs, attach them to the floor with carpet tape. Make sure that you have a light switch at the top of the stairs and the bottom of the stairs. If you do not have them, ask someone to add them for you. What else can I do to help prevent falls? Wear shoes that: Do not have high heels. Have rubber bottoms. Are comfortable and fit you well. Are closed at the toe. Do not wear sandals. If you use a stepladder: Make sure that it is fully opened. Do not climb a closed stepladder. Make sure that both sides of the stepladder are locked  into place. Ask someone to hold it for you, if possible. Clearly mark and make sure that you can see: Any grab bars or handrails. First and last steps. Where the edge of each step is. Use tools that help you move around (mobility aids) if they are needed. These include: Canes. Walkers. Scooters. Crutches. Turn on the lights when you go into a dark area. Replace any light bulbs as soon as they burn out. Set up your furniture so you have a clear path. Avoid moving your furniture around. If any of your floors are uneven, fix them. If there are any pets around you, be aware of where they are. Review your medicines with your doctor. Some medicines can make you feel dizzy. This can increase your chance of falling. Ask your doctor what other things that you can do to help prevent falls. This information is not intended to replace advice given to you by your health care provider. Make sure you discuss any questions you have with your health care provider. Document Released: 10/02/2009 Document Revised: 05/13/2016 Document Reviewed: 01/10/2015 Elsevier Interactive Patient Education  2017 [employer].

## 2022-09-20 NOTE — Progress Notes (Signed)
I connected with Milanna Kozlov today by telephone and verified that I am speaking with the correct person using two identifiers. Location patient: home Location provider: work Persons participating in the virtual visit: Lady Gary, Glenna Durand LPN.   I discussed the limitations, risks, security and privacy concerns of performing an evaluation and management service by telephone and the availability of in person appointments. I also discussed with the patient that there may be a patient responsible charge related to this service. The patient expressed understanding and verbally consented to this telephonic visit.    Interactive audio and video telecommunications were attempted between this provider and patient, however failed, due to patient having technical difficulties OR patient did not have access to video capability.  We continued and completed visit with audio only.     Vital signs may be patient reported or missing.  Subjective:   Patricia Henry is a 71 y.o. female who presents for Medicare Annual (Subsequent) preventive examination.  Review of Systems     Cardiac Risk Factors include: advanced age (>45mn, >>53women);hypertension     Objective:    Today's Vitals   09/20/22 1456  Weight: 144 lb (65.3 kg)  Height: '5\' 7"'$  (1.702 m)   Body mass index is 22.55 kg/m.     09/20/2022    3:03 PM 02/12/2022    9:44 AM 08/27/2021    1:56 PM 08/26/2020    3:03 PM 11/07/2018    8:34 AM 07/06/2018    2:26 PM 10/23/2014    9:40 AM  Advanced Directives  Does Patient Have a Medical Advance Directive? Yes Yes Yes Yes No Yes Yes  Type of [place]of [place]  [place]  [place];Out of facility DNR (pink MOST or yellow form)   Does patient want to make changes to medical advance directive?    No - Patient declined  No - Patient declined   Copy of  [place] Chart? No - copy requested  No - copy requested Yes - validated most recent copy scanned in chart (See row information)  No - copy requested   Would patient like information on creating a medical advance directive?     No - Patient declined      Current Medications (verified) Outpatient Encounter Medications as of 09/20/2022  Medication Sig   azelastine (ASTELIN) 0.1 % nasal spray Place into both nostrils 2 (two) times daily. Use in each nostril as directed   clonazePAM (KLONOPIN) 0.5 MG tablet Take 1 tablet (0.5 mg total) by mouth 2 (two) times daily as needed for anxiety.   co-enzyme Q-10 30 MG capsule Take 30 mg by mouth 3 (three) times daily.   Cyanocobalamin (B-12 PO) Take by mouth.   lisinopril (ZESTRIL) 5 MG tablet TAKE 1 TABLET (5 MG TOTAL) BY MOUTH DAILY. (Patient taking differently: Take 10 mg by mouth daily.)   loperamide (IMODIUM) 2 MG capsule Take by mouth as needed for diarrhea or loose stools.   methylphenidate (RITALIN) 10 MG tablet Take 10 mg by mouth as needed.   Omega-3 1000 MG CAPS Take by mouth.   OnabotulinumtoxinA (BOTOX IJ) Inject as directed. Inject into eyelids every 90 days   Phenylephrine HCl (AFRIN ALLERGY NA) Place into the nose as needed. Diluted with ocean nasal spray   sertraline (ZOLOFT) 25 MG tablet Take 3 tablets (75 mg total) by mouth daily. (Patient taking differently:  Take 50 mg by mouth daily.)   vitamin C (ASCORBIC ACID) 500 MG tablet Take 500 mg by mouth daily.   clonazePAM (KLONOPIN) 0.5 MG tablet    No facility-administered encounter medications on file as of 09/20/2022.    Allergies (verified) Patient has no known allergies.   History: Past Medical History:  Diagnosis Date   Benign essential blepharospasm    Fatigue    Hypertension    no meds   IBS (irritable bowel syndrome)    Migraines    SOB (shortness of breath)    UTI (urinary tract infection)    Past Surgical History:  Procedure Laterality Date    BLEPHAROPLASTY     EYE SURGERY  2014   SLK/ eyelid surgery/ Bil eyes   ovary removed     right side/ over 76 years   Family History  Problem Relation Age of Onset   Alzheimer's disease Mother    Heart failure Mother    Arthritis Mother    Depression Mother    Diabetes Mother    Hearing loss Father    Heart attack Father    Arthritis Sister    Hypertension Brother    Arthritis Maternal Grandmother    Social History   Socioeconomic History   Marital status: Widowed    Spouse name: John   Number of children: 0   Years of education: 15   Highest education level: Not on file  Occupational History   Occupation: semi retired    Comment: Oncologist  Tobacco Use   Smoking status: Former    Types: Cigarettes    Quit date: 05/23/2013    Years since quitting: 9.3   Smokeless tobacco: Never  Vaping Use   Vaping Use: Never used  Substance and Sexual Activity   Alcohol use: Yes    Alcohol/week: 7.0 standard drinks of alcohol    Types: 7 Glasses of wine per week    Comment: week   Drug use: No   Sexual activity: Not on file  Other Topics Concern   Not on file  Social History Narrative   Patient consumes 2 cups of caffiene ,right handed   An elevator, 3 storys   Social Determinants of Health   Financial Resource Strain: Low Risk  (09/20/2022)   Overall Financial Resource Strain (CARDIA)    Difficulty of Paying Living Expenses: Not hard at all  Food Insecurity: No Food Insecurity (09/20/2022)   Hunger Vital Sign    Worried About Running Out of Food in the Last Year: Never true    Ran Out of Food in the Last Year: Never true  Transportation Needs: No Transportation Needs (09/20/2022)   PRAPARE - Hydrologist (Medical): No    Lack of Transportation (Non-Medical): No  Physical Activity: Sufficiently Active (09/20/2022)   Exercise Vital Sign    Days of Exercise per Week: 7 days    Minutes of Exercise per Session: 50 min  Stress: No Stress Concern  Present (09/20/2022)   [place]    Feeling of Stress : Not at all  Social Connections: Moderately Isolated (08/27/2021)   Social Connection and Isolation Panel [NHANES]    Frequency of Communication with Friends and Family: Three times a week    Frequency of Social Gatherings with Friends and Family: Three times a week    Attends Religious Services: Never    Active Member of Clubs or Organizations: Yes  Attends Archivist Meetings: More than 4 times per year    Marital Status: Widowed    Tobacco Counseling Counseling given: Not Answered   Clinical Intake:  Pre-visit preparation completed: Yes  Pain : No/denies pain     Nutritional Status: BMI of 19-24  Normal Nutritional Risks: None Diabetes: No  How often do you need to have someone help you when you read instructions, pamphlets, or other written materials from your doctor or pharmacy?: 1 - Never What is the last grade level you completed in school?: associates degree  Diabetic? no  Interpreter Needed?: No  Information entered by :: NAllen LPN   Activities of Daily Living    09/20/2022    3:04 PM  In your present state of health, do you have any difficulty performing the following activities:  Hearing? 0  Vision? 0  Difficulty concentrating or making decisions? 0  Walking or climbing stairs? 0  Dressing or bathing? 0  Doing errands, shopping? 0  Preparing Food and eating ? N  Using the Toilet? N  In the past six months, have you accidently leaked urine? N  Do you have problems with loss of bowel control? N  Managing your Medications? N  Managing your Finances? N  Housekeeping or managing your Housekeeping? N    Patient Care Team: Billie Ruddy, MD as PCP - General (Family Medicine) Early Osmond, MD as PCP - Cardiology (Cardiology) Star Age, MD as Attending Physician (Neurology) Almedia Balls, MD as Consulting Physician  (Orthopedic Surgery)  Indicate any recent Medical Services you may have received from other than [facility] providers in the past year (date may be approximate).     Assessment:   This is a routine wellness examination for Mabell.  Hearing/Vision screen Vision Screening - Comments:: Regular eye exams, [facility]  Dietary issues and exercise activities discussed: Current Exercise Habits: Home exercise routine, Type of exercise: walking, Time (Minutes): 45, Frequency (Times/Week): 7, Weekly Exercise (Minutes/Week): 315   Goals Addressed             This Visit's Progress    Patient Stated       09/20/2022, wants to get weaned off zoloft       Depression Screen    09/20/2022    3:03 PM 06/23/2022    9:49 AM 06/23/2022    9:46 AM 01/08/2022    2:26 PM 08/27/2021    1:54 PM 12/26/2020    3:57 PM 08/26/2020    3:06 PM  PHQ 2/9 Scores  PHQ - 2 Score 0 1 1 0 '3 2 1  '$ PHQ- 9 Score  '3  5 10 14 1    '$ Fall Risk    09/20/2022    3:03 PM 06/23/2022    9:45 AM 02/12/2022    9:44 AM 01/08/2022    2:26 PM 08/27/2021    1:59 PM  Fall Risk   Falls in the past year? 0 0 0 1 0  Number falls in past yr: 0 0 0 1 0  Injury with Fall? 0 0 0 0 0  Risk for fall due to : Medication side effect Impaired vision;No Fall Risks   No Fall Risks  Follow up Falls prevention discussed;Education provided;Falls evaluation completed Falls evaluation completed;Falls prevention discussed   Falls evaluation completed    FALL RISK PREVENTION PERTAINING TO THE HOME:  Any stairs in or around the home? Yes  If so, are there any without handrails? No  Home free  of loose throw rugs in walkways, pet beds, electrical cords, etc? Yes  Adequate lighting in your home to reduce risk of falls? Yes   ASSISTIVE DEVICES UTILIZED TO PREVENT FALLS:  Life alert? No  Use of a cane, walker or w/c? No  Grab bars in the bathroom? Yes  Shower chair or bench in shower? No  Elevated toilet seat or a handicapped toilet? Yes   TIMED UP AND  GO:  Was the test performed? No .      Cognitive Function:        09/20/2022    3:04 PM  6CIT Screen  What Year? 0 points  What month? 0 points  What time? 0 points  Count back from 20 0 points  Months in reverse 0 points  Repeat phrase 2 points  Total Score 2 points    Immunizations Immunization History  Administered Date(s) Administered   Fluad Quad(high Dose 65+) 10/25/2019, 12/09/2020   Influenza, High Dose Seasonal PF 02/03/2018, 10/12/2018, 10/25/2019, 12/09/2020, 03/30/2022   Influenza-Unspecified 09/22/2021   PFIZER Comirnaty(Gray Top)Covid-19 Tri-Sucrose Vaccine 01/14/2020, 02/04/2020, 08/20/2020, 03/30/2021, 03/30/2022   PFIZER(Purple Top)SARS-COV-2 Vaccination 01/14/2020, 02/04/2020, 08/20/2020, 03/30/2021, 03/30/2022   Pfizer Covid-19 Vaccine Bivalent Booster 8yr & up 10/13/2021   Pneumococcal Conjugate-13 02/11/2016   Pneumococcal Polysaccharide-23 08/20/2020, 03/30/2022   Tdap 11/23/2020   Zoster Recombinat (Shingrix) 01/20/2022, 06/10/2022    TDAP status: Up to date  Flu Vaccine status: Due, Education has been provided regarding the importance of this vaccine. Advised may receive this vaccine at local pharmacy or Health Dept. Aware to provide a copy of the vaccination record if obtained from local pharmacy or Health Dept. Verbalized acceptance and understanding.  Pneumococcal vaccine status: Up to date  Covid-19 vaccine status: Completed vaccines  Qualifies for Shingles Vaccine? Yes   Zostavax completed Yes   Shingrix Completed?: Yes  Screening Tests Health Maintenance  Topic Date Due   INFLUENZA VACCINE  07/20/2022   MAMMOGRAM  06/11/2023   DEXA SCAN  01/21/2024   TETANUS/TDAP  11/23/2030   Pneumonia Vaccine 71 Years old  Completed   COVID-19 Vaccine  Completed   Hepatitis C Screening  Completed   Zoster Vaccines- Shingrix  Completed   HPV VACCINES  Aged Out    Health Maintenance  Health Maintenance Due  Topic Date Due   INFLUENZA  VACCINE  07/20/2022    Colorectal cancer screening completed within past 2-3 years per patient   Mammogram status: Completed 06/10/2022. Repeat every year  Bone Density status: Completed 01/20/2022.   Lung Cancer Screening: (Low Dose CT Chest recommended if Age 72-80years, 30 pack-year currently smoking OR have quit w/in 15years.) does not qualify.   Lung Cancer Screening Referral: no  Additional Screening:  Hepatitis C Screening: does qualify; Completed 06/29/2021  Vision Screening: Recommended annual ophthalmology exams for early detection of glaucoma and other disorders of the eye. Is the patient up to date with their annual eye exam?  Yes  Who is the provider or what is the name of the office in which the patient [facility]ends annual [facility] exams[facility] If pt is not established with a provider, would they like to be referred to a provider to establish care? No .   Dental Screening: Recommended annual dental exams for proper oral hygiene  Community Resource Referral / Chronic Care Management: CRR required this visit?  No   CCM required this visit?  No      Plan:     I have personally  reviewed and noted the following in the patient's chart:   Medical and social history Use of alcohol, tobacco or illicit drugs  Current medications and supplements including opioid prescriptions. Patient is not currently taking opioid prescriptions. Functional ability and status Nutritional status Physical activity Advanced directives List of other physicians Hospitalizations, surgeries, and ER visits in previous 12 months Vitals Screenings to include cognitive, depression, and falls Referrals and appointments  In addition, I have reviewed and discussed with patient certain preventive protocols, quality metrics, and best practice recommendations. A written personalized care plan for preventive services as well as general preventive health recommendations were provided to patient.      Kellie Simmering, LPN   94/07/[id number]   Nurse Notes: none  Due to this being a virtual visit, the after visit summary with patients personalized plan was offered to patient via mail or my-chart. Patient would like to access on my-chart

## 2022-10-05 DIAGNOSIS — L270 Generalized skin eruption due to drugs and medicaments taken internally: Secondary | ICD-10-CM | POA: Diagnosis not present

## 2022-10-05 DIAGNOSIS — R21 Rash and other nonspecific skin eruption: Secondary | ICD-10-CM | POA: Diagnosis not present

## 2022-10-05 DIAGNOSIS — Z85828 Personal history of other malignant neoplasm of skin: Secondary | ICD-10-CM | POA: Diagnosis not present

## 2022-10-07 ENCOUNTER — Ambulatory Visit
Admission: RE | Admit: 2022-10-07 | Discharge: 2022-10-07 | Disposition: A | Discharge status: Home / Self Care | Payer: Medicare Other | Source: Ambulatory Visit | Attending: Family Medicine | Admitting: Family Medicine

## 2022-10-07 DIAGNOSIS — R911 Solitary pulmonary nodule: Secondary | ICD-10-CM | POA: Diagnosis not present

## 2022-10-07 DIAGNOSIS — R918 Other nonspecific abnormal finding of lung field: Secondary | ICD-10-CM | POA: Diagnosis not present

## 2022-10-07 DIAGNOSIS — I700 Atherosclerosis of aorta: Secondary | ICD-10-CM | POA: Diagnosis not present

## 2022-10-13 DIAGNOSIS — G245 Blepharospasm: Secondary | ICD-10-CM | POA: Diagnosis not present

## 2022-11-15 DIAGNOSIS — L501 Idiopathic urticaria: Secondary | ICD-10-CM | POA: Diagnosis not present

## 2022-11-15 DIAGNOSIS — R0602 Shortness of breath: Secondary | ICD-10-CM | POA: Diagnosis not present

## 2022-11-15 DIAGNOSIS — J300 Vasomotor rhinitis: Secondary | ICD-10-CM | POA: Diagnosis not present

## 2022-11-26 DIAGNOSIS — R0609 Other forms of dyspnea: Secondary | ICD-10-CM | POA: Diagnosis not present

## 2022-11-26 DIAGNOSIS — R0602 Shortness of breath: Secondary | ICD-10-CM | POA: Diagnosis not present

## 2022-11-30 DIAGNOSIS — R0609 Other forms of dyspnea: Secondary | ICD-10-CM | POA: Diagnosis not present

## 2022-11-30 DIAGNOSIS — H6123 Impacted cerumen, bilateral: Secondary | ICD-10-CM | POA: Diagnosis not present

## 2022-12-01 DIAGNOSIS — G245 Blepharospasm: Secondary | ICD-10-CM | POA: Diagnosis not present

## 2022-12-01 DIAGNOSIS — G248 Other dystonia: Secondary | ICD-10-CM | POA: Diagnosis not present

## 2022-12-01 DIAGNOSIS — R069 Unspecified abnormalities of breathing: Secondary | ICD-10-CM | POA: Diagnosis not present

## 2022-12-16 DIAGNOSIS — Z23 Encounter for immunization: Secondary | ICD-10-CM | POA: Diagnosis not present

## 2022-12-18 ENCOUNTER — Encounter: Payer: Self-pay | Admitting: Family Medicine

## 2022-12-21 ENCOUNTER — Ambulatory Visit: Payer: Medicare Other | Admitting: Speech Pathology

## 2022-12-23 DIAGNOSIS — G248 Other dystonia: Secondary | ICD-10-CM | POA: Diagnosis not present

## 2022-12-23 DIAGNOSIS — R0609 Other forms of dyspnea: Secondary | ICD-10-CM | POA: Diagnosis not present

## 2023-01-11 DIAGNOSIS — F32A Depression, unspecified: Secondary | ICD-10-CM | POA: Diagnosis not present

## 2023-01-12 DIAGNOSIS — H5712 Ocular pain, left eye: Secondary | ICD-10-CM | POA: Diagnosis not present

## 2023-01-12 DIAGNOSIS — G245 Blepharospasm: Secondary | ICD-10-CM | POA: Diagnosis not present

## 2023-01-14 ENCOUNTER — Encounter: Payer: Self-pay | Admitting: Pulmonary Disease

## 2023-01-14 NOTE — Telephone Encounter (Signed)
Mychart message sent by pt: Patricia Henry [facility] (supporting Lanier Clam, MD)1 hour ago (8:03 AM)    In December 2023 I completed a cardiopulmonary stress test at [facility]. I can read the test results but can not interpret them. I don't have an appointment scheduled with Dr Silas Flood until April 2024. Can some one explain the results before that appointment? Thank You   Dr. Silas Flood, please advise.

## 2023-01-18 ENCOUNTER — Ambulatory Visit: Payer: Medicare Other | Admitting: Speech Pathology

## 2023-01-18 DIAGNOSIS — F32A Depression, unspecified: Secondary | ICD-10-CM | POA: Diagnosis not present

## 2023-01-25 DIAGNOSIS — F329 Major depressive disorder, single episode, unspecified: Secondary | ICD-10-CM | POA: Diagnosis not present

## 2023-02-01 DIAGNOSIS — F329 Major depressive disorder, single episode, unspecified: Secondary | ICD-10-CM | POA: Diagnosis not present

## 2023-02-13 ENCOUNTER — Encounter (HOSPITAL_COMMUNITY): Payer: Self-pay

## 2023-02-13 ENCOUNTER — Ambulatory Visit (HOSPITAL_COMMUNITY)
Admission: EM | Admit: 2023-02-13 | Discharge: 2023-02-13 | Disposition: A | Discharge status: Home / Self Care | Payer: Medicare Other | Attending: Emergency Medicine | Admitting: Emergency Medicine

## 2023-02-13 DIAGNOSIS — N3001 Acute cystitis with hematuria: Secondary | ICD-10-CM | POA: Diagnosis not present

## 2023-02-13 LAB — POCT URINALYSIS DIPSTICK, ED / UC
Glucose, UA: 100 mg/dL — AB
Nitrite: POSITIVE — AB
Protein, ur: 100 mg/dL — AB
Specific Gravity, Urine: 1.02 (ref 1.005–1.030)
Urobilinogen, UA: 2 mg/dL — ABNORMAL HIGH (ref 0.0–1.0)
pH: 5 (ref 5.0–8.0)

## 2023-02-13 MED ORDER — CEPHALEXIN 500 MG PO CAPS: 500.0000 mg | ORAL_CAPSULE | Freq: Two times a day (BID) | ORAL | 0 refills | Status: AC

## 2023-02-13 NOTE — ED Triage Notes (Signed)
Patient here today for burning in urination upon waking this morning. She took AZO 2 hours ago which helped.

## 2023-02-13 NOTE — ED Provider Notes (Signed)
Patricia Henry    CSN: [id number] Arrival date & time: 02/13/23  1159      History   Chief Complaint Chief Complaint  Patient presents with   Urinary Frequency    Home test was positive - Entered by patient    HPI Patricia Henry is a 72 y.o. female.  Presents with dysuria that began this morning Pain was 8/10 Denies frequency, urgency, abdominal pain, flank pain, n/v, fevers Drinking water She took a single dose of AZO that helped pain  Past Medical History:  Diagnosis Date   Benign essential blepharospasm    Fatigue    Hypertension    no meds   IBS (irritable bowel syndrome)    Migraines    SOB (shortness of breath)    UTI (urinary tract infection)     Patient Active Problem List   Diagnosis Date Noted   Blepharospasm of both eyes 01/11/2021   Essential hypertension 01/11/2021   Adjustment disorder with other symptoms 07/04/2020    Past Surgical History:  Procedure Laterality Date   BLEPHAROPLASTY     EYE SURGERY  2014   [facility]/ eyelid surgery/ Bil eyes   ovary removed     right side/ over 20 years    OB History     Gravida  1   Para      Term      Preterm      AB  1   Living  0      SAB      IAB  1   Ectopic      Multiple      Live Births  0            Home Medications    Prior to Admission medications   Medication Sig Start Date End Date Taking? Authorizing Provider  azelastine (ASTELIN) 0.1 % nasal spray Place into both nostrils 2 (two) times daily. Use in each nostril as directed   Yes [provider]  cephALEXin (KEFLEX) 500 MG capsule Take 1 capsule (500 mg total) by mouth 2 (two) times daily for 5 days. 02/13/23 02/18/23 Yes Derrion Tritz, Wells Guiles, PA-C  clonazePAM (KLONOPIN) 0.5 MG tablet Take 1 tablet (0.5 mg total) by mouth 2 (two) times daily as needed for anxiety. 04/15/20  Yes Billie Ruddy, MD  lisinopril (ZESTRIL) 5 MG tablet TAKE 1 TABLET (5 MG TOTAL) BY MOUTH DAILY. Patient taking differently: Take 10  mg by mouth daily. 07/27/22  Yes Billie Ruddy, MD  loperamide (IMODIUM) 2 MG capsule Take by mouth as needed for diarrhea or loose stools.   Yes [provider]  methylphenidate (RITALIN) 10 MG tablet Take 10 mg by mouth as needed.   Yes [provider]  OnabotulinumtoxinA (BOTOX IJ) Inject as directed. Inject into eyelids every 90 days   Yes [provider]  Phenylephrine HCl (AFRIN ALLERGY NA) Place into the nose as needed. Diluted with ocean nasal spray    [provider]    Family History Family History  Problem Relation Age of Onset   Alzheimer's disease Mother    Heart failure Mother    Arthritis Mother    Depression Mother    Diabetes Mother    Hearing loss Father    Heart attack Father    Arthritis Sister    Hypertension Brother    Arthritis Maternal Grandmother     Social History Social History   Tobacco Use   Smoking status: Former  Types: Cigarettes    Quit date: 05/23/2013    Years since quitting: 9.7   Smokeless tobacco: Never  Vaping Use   Vaping Use: Never used  Substance Use Topics   Alcohol use: Yes    Alcohol/week: 7.0 standard drinks of alcohol    Types: 7 Glasses of wine per week    Comment: week   Drug use: No     Allergies   Patient has no known allergies.   Review of Systems Review of Systems As per HPI  Physical Exam Triage Vital Signs ED Triage Vitals  Enc Vitals Group     BP 02/13/23 1303 129/75     Pulse Rate 02/13/23 1303 78     Resp 02/13/23 1303 16     Temp 02/13/23 1303 97.6 F (36.4 C)     Temp Source 02/13/23 1303 Oral     SpO2 02/13/23 1303 97 %     Weight 02/13/23 1259 140 lb (63.5 kg)     Height 02/13/23 1259 '5\' 6"'$  (1.676 m)     Head Circumference --      Peak Flow --      Pain Score 02/13/23 1258 8     Pain Loc --      Pain Edu? --      Excl. in [place]? --    No data found.  Updated Vital Signs BP 129/75 (BP Location: Right Arm)   Pulse 78   Temp 97.6 F (36.4 C) (Oral)    Resp 16   Ht '5\' 6"'$  (1.676 m)   Wt 140 lb (63.5 kg)   SpO2 97%   BMI 22.60 kg/m   Physical Exam Vitals and nursing note reviewed.  Constitutional:      General: She is not in acute distress. HENT:     Mouth/Throat:     Mouth: Mucous membranes are moist.     Pharynx: Oropharynx is clear.  Eyes:     Conjunctiva/sclera: Conjunctivae normal.     Pupils: Pupils are equal, round, and reactive to light.  Cardiovascular:     Rate and Rhythm: Normal rate and regular rhythm.     Heart sounds: Normal heart sounds.  Pulmonary:     Effort: Pulmonary effort is normal.     Breath sounds: Normal breath sounds.  Abdominal:     Palpations: Abdomen is soft.     Tenderness: There is no abdominal tenderness. There is no right CVA tenderness or left CVA tenderness.  Neurological:     Mental Status: She is alert and oriented to person, place, and time.      UC Treatments / Results  Labs (all labs ordered are listed, but only abnormal results are displayed) Labs Reviewed  POCT URINALYSIS DIPSTICK, ED / UC - Abnormal; Notable for the following components:      Result Value   Glucose, UA 100 (*)    Bilirubin Urine SMALL (*)    Ketones, ur TRACE (*)    Hgb urine dipstick LARGE (*)    Protein, ur 100 (*)    Urobilinogen, UA 2.0 (*)    Nitrite POSITIVE (*)    Leukocytes,Ua LARGE (*)    All other components within normal limits  URINE CULTURE    EKG  Radiology No results found.  Procedures Procedures   Medications Ordered in UC Medications - No data to display  Initial Impression / Assessment and Plan / UC Course  I have reviewed the triage vital signs and the nursing notes.  Pertinent labs & imaging results that were available during my care of the patient were reviewed by me and considered in my medical decision making (see chart for details).  Afebrile, well appearing\ UA suggesting infection. Unknown if azo has affected this.  Large leuks and hgb, +nitrates Culture is  pending Will treat with Keflex BID x 5 days Increase fluids Return precautions discussed. Patient agrees to plan   Final Clinical Impressions(s) / UC Diagnoses   Final diagnoses:  Acute cystitis with hematuria     Discharge Instructions      Please take medication as prescribed. Take with food to avoid upset stomach. Drink lots of water!  Please return if symptoms persist despite medication  We will call you if culture requires change in therapy      ED Prescriptions     Medication Sig Dispense Auth. Provider   cephALEXin (KEFLEX) 500 MG capsule Take 1 capsule (500 mg total) by mouth 2 (two) times daily for 5 days. 10 capsule Jeweldean Drohan, Wells Guiles, PA-C      PDMP not reviewed this encounter.   [place], [place] 02/13/23 1352

## 2023-02-13 NOTE — Discharge Instructions (Addendum)
Please take medication as prescribed. Take with food to avoid upset stomach. Drink lots of water!  Please return if symptoms persist despite medication  We will call you if culture requires change in therapy

## 2023-02-14 ENCOUNTER — Ambulatory Visit (HOSPITAL_COMMUNITY): Payer: Medicare Other

## 2023-02-14 LAB — URINE CULTURE: Culture: NO GROWTH

## 2023-02-22 DIAGNOSIS — F32A Depression, unspecified: Secondary | ICD-10-CM | POA: Diagnosis not present

## 2023-02-24 ENCOUNTER — Other Ambulatory Visit: Payer: Self-pay | Admitting: Family Medicine

## 2023-02-24 DIAGNOSIS — I10 Essential (primary) hypertension: Secondary | ICD-10-CM

## 2023-03-08 DIAGNOSIS — L570 Actinic keratosis: Secondary | ICD-10-CM | POA: Diagnosis not present

## 2023-03-08 DIAGNOSIS — L821 Other seborrheic keratosis: Secondary | ICD-10-CM | POA: Diagnosis not present

## 2023-03-08 DIAGNOSIS — L578 Other skin changes due to chronic exposure to nonionizing radiation: Secondary | ICD-10-CM | POA: Diagnosis not present

## 2023-03-08 DIAGNOSIS — L281 Prurigo nodularis: Secondary | ICD-10-CM | POA: Diagnosis not present

## 2023-03-08 DIAGNOSIS — Z85828 Personal history of other malignant neoplasm of skin: Secondary | ICD-10-CM | POA: Diagnosis not present

## 2023-03-08 DIAGNOSIS — L814 Other melanin hyperpigmentation: Secondary | ICD-10-CM | POA: Diagnosis not present

## 2023-03-15 DIAGNOSIS — F32A Depression, unspecified: Secondary | ICD-10-CM | POA: Diagnosis not present

## 2023-03-23 DIAGNOSIS — R490 Dysphonia: Secondary | ICD-10-CM | POA: Diagnosis not present

## 2023-03-23 DIAGNOSIS — J387 Other diseases of larynx: Secondary | ICD-10-CM | POA: Diagnosis not present

## 2023-03-23 DIAGNOSIS — Z87891 Personal history of nicotine dependence: Secondary | ICD-10-CM | POA: Diagnosis not present

## 2023-03-23 DIAGNOSIS — R0609 Other forms of dyspnea: Secondary | ICD-10-CM | POA: Diagnosis not present

## 2023-03-30 DIAGNOSIS — J300 Vasomotor rhinitis: Secondary | ICD-10-CM | POA: Diagnosis not present

## 2023-03-30 DIAGNOSIS — R0602 Shortness of breath: Secondary | ICD-10-CM | POA: Diagnosis not present

## 2023-03-30 DIAGNOSIS — L501 Idiopathic urticaria: Secondary | ICD-10-CM | POA: Diagnosis not present

## 2023-04-13 DIAGNOSIS — G245 Blepharospasm: Secondary | ICD-10-CM | POA: Diagnosis not present

## 2023-04-13 DIAGNOSIS — H5712 Ocular pain, left eye: Secondary | ICD-10-CM | POA: Diagnosis not present

## 2023-04-17 ENCOUNTER — Other Ambulatory Visit: Payer: Self-pay | Admitting: Family Medicine

## 2023-04-17 DIAGNOSIS — I10 Essential (primary) hypertension: Secondary | ICD-10-CM

## 2023-05-04 DIAGNOSIS — R42 Dizziness and giddiness: Secondary | ICD-10-CM | POA: Diagnosis not present

## 2023-05-10 DIAGNOSIS — Z87891 Personal history of nicotine dependence: Secondary | ICD-10-CM | POA: Diagnosis not present

## 2023-05-10 DIAGNOSIS — J387 Other diseases of larynx: Secondary | ICD-10-CM | POA: Diagnosis not present

## 2023-05-10 DIAGNOSIS — R0609 Other forms of dyspnea: Secondary | ICD-10-CM | POA: Diagnosis not present

## 2023-05-10 DIAGNOSIS — R490 Dysphonia: Secondary | ICD-10-CM | POA: Diagnosis not present

## 2023-05-12 DIAGNOSIS — Z85828 Personal history of other malignant neoplasm of skin: Secondary | ICD-10-CM | POA: Diagnosis not present

## 2023-05-12 DIAGNOSIS — L820 Inflamed seborrheic keratosis: Secondary | ICD-10-CM | POA: Diagnosis not present

## 2023-05-18 DIAGNOSIS — R0609 Other forms of dyspnea: Secondary | ICD-10-CM | POA: Diagnosis not present

## 2023-05-18 DIAGNOSIS — J387 Other diseases of larynx: Secondary | ICD-10-CM | POA: Diagnosis not present

## 2023-05-18 DIAGNOSIS — Z87891 Personal history of nicotine dependence: Secondary | ICD-10-CM | POA: Diagnosis not present

## 2023-05-18 DIAGNOSIS — R490 Dysphonia: Secondary | ICD-10-CM | POA: Diagnosis not present

## 2023-05-26 DIAGNOSIS — R490 Dysphonia: Secondary | ICD-10-CM | POA: Diagnosis not present

## 2023-05-26 DIAGNOSIS — R0609 Other forms of dyspnea: Secondary | ICD-10-CM | POA: Diagnosis not present

## 2023-05-26 DIAGNOSIS — J387 Other diseases of larynx: Secondary | ICD-10-CM | POA: Diagnosis not present

## 2023-05-26 DIAGNOSIS — Z87891 Personal history of nicotine dependence: Secondary | ICD-10-CM | POA: Diagnosis not present

## 2023-06-23 DIAGNOSIS — T679XXA Effect of heat and light, unspecified, initial encounter: Secondary | ICD-10-CM | POA: Diagnosis not present

## 2023-06-23 DIAGNOSIS — R55 Syncope and collapse: Secondary | ICD-10-CM | POA: Diagnosis not present

## 2023-06-23 DIAGNOSIS — R1111 Vomiting without nausea: Secondary | ICD-10-CM | POA: Diagnosis not present

## 2023-06-23 DIAGNOSIS — R197 Diarrhea, unspecified: Secondary | ICD-10-CM | POA: Diagnosis not present

## 2023-06-23 DIAGNOSIS — R231 Pallor: Secondary | ICD-10-CM | POA: Diagnosis not present

## 2023-07-14 DIAGNOSIS — G245 Blepharospasm: Secondary | ICD-10-CM | POA: Diagnosis not present

## 2023-07-22 DIAGNOSIS — Z1231 Encounter for screening mammogram for malignant neoplasm of breast: Secondary | ICD-10-CM | POA: Diagnosis not present

## 2023-08-16 ENCOUNTER — Other Ambulatory Visit: Payer: Self-pay | Admitting: Family Medicine

## 2023-08-16 ENCOUNTER — Ambulatory Visit
Admission: RE | Admit: 2023-08-16 | Discharge: 2023-08-16 | Disposition: A | Discharge status: Home / Self Care | Payer: Medicare Other | Source: Ambulatory Visit | Attending: Family Medicine | Admitting: Family Medicine

## 2023-08-16 DIAGNOSIS — M79645 Pain in left finger(s): Secondary | ICD-10-CM | POA: Diagnosis not present

## 2023-08-16 DIAGNOSIS — M19042 Primary osteoarthritis, left hand: Secondary | ICD-10-CM | POA: Diagnosis not present

## 2023-09-10 DIAGNOSIS — Z23 Encounter for immunization: Secondary | ICD-10-CM | POA: Diagnosis not present

## 2023-10-03 DIAGNOSIS — H35373 Puckering of macula, bilateral: Secondary | ICD-10-CM | POA: Diagnosis not present

## 2023-10-13 DIAGNOSIS — G245 Blepharospasm: Secondary | ICD-10-CM | POA: Diagnosis not present

## 2023-10-13 DIAGNOSIS — H26493 Other secondary cataract, bilateral: Secondary | ICD-10-CM | POA: Diagnosis not present

## 2023-10-17 DIAGNOSIS — D692 Other nonthrombocytopenic purpura: Secondary | ICD-10-CM | POA: Diagnosis not present

## 2023-10-17 DIAGNOSIS — Z85828 Personal history of other malignant neoplasm of skin: Secondary | ICD-10-CM | POA: Diagnosis not present

## 2023-10-17 DIAGNOSIS — D1801 Hemangioma of skin and subcutaneous tissue: Secondary | ICD-10-CM | POA: Diagnosis not present

## 2023-10-17 DIAGNOSIS — D225 Melanocytic nevi of trunk: Secondary | ICD-10-CM | POA: Diagnosis not present

## 2023-10-17 DIAGNOSIS — L821 Other seborrheic keratosis: Secondary | ICD-10-CM | POA: Diagnosis not present

## 2023-10-17 DIAGNOSIS — D2261 Melanocytic nevi of right upper limb, including shoulder: Secondary | ICD-10-CM | POA: Diagnosis not present

## 2023-10-17 DIAGNOSIS — D2262 Melanocytic nevi of left upper limb, including shoulder: Secondary | ICD-10-CM | POA: Diagnosis not present

## 2023-10-17 DIAGNOSIS — D2271 Melanocytic nevi of right lower limb, including hip: Secondary | ICD-10-CM | POA: Diagnosis not present

## 2023-10-17 DIAGNOSIS — D2272 Melanocytic nevi of left lower limb, including hip: Secondary | ICD-10-CM | POA: Diagnosis not present

## 2023-12-05 DIAGNOSIS — H04123 Dry eye syndrome of bilateral lacrimal glands: Secondary | ICD-10-CM | POA: Diagnosis not present

## 2023-12-05 DIAGNOSIS — H16263 Vernal keratoconjunctivitis, with limbar and corneal involvement, bilateral: Secondary | ICD-10-CM | POA: Diagnosis not present

## 2023-12-05 DIAGNOSIS — H26492 Other secondary cataract, left eye: Secondary | ICD-10-CM | POA: Diagnosis not present

## 2023-12-05 DIAGNOSIS — Z961 Presence of intraocular lens: Secondary | ICD-10-CM | POA: Diagnosis not present

## 2023-12-12 DIAGNOSIS — Z961 Presence of intraocular lens: Secondary | ICD-10-CM | POA: Diagnosis not present

## 2023-12-29 ENCOUNTER — Ambulatory Visit
Admission: RE | Admit: 2023-12-29 | Discharge: 2023-12-29 | Disposition: A | Discharge status: Home / Self Care | Payer: Medicare Other | Source: Ambulatory Visit | Attending: Family Medicine | Admitting: Family Medicine

## 2023-12-29 ENCOUNTER — Other Ambulatory Visit: Payer: Self-pay | Admitting: Family Medicine

## 2023-12-29 DIAGNOSIS — M25572 Pain in left ankle and joints of left foot: Secondary | ICD-10-CM

## 2023-12-29 DIAGNOSIS — M19072 Primary osteoarthritis, left ankle and foot: Secondary | ICD-10-CM | POA: Diagnosis not present

## 2024-01-04 DIAGNOSIS — H26491 Other secondary cataract, right eye: Secondary | ICD-10-CM | POA: Diagnosis not present

## 2024-01-11 DIAGNOSIS — Z961 Presence of intraocular lens: Secondary | ICD-10-CM | POA: Diagnosis not present

## 2024-01-26 DIAGNOSIS — G245 Blepharospasm: Secondary | ICD-10-CM | POA: Diagnosis not present

## 2024-01-26 DIAGNOSIS — H26493 Other secondary cataract, bilateral: Secondary | ICD-10-CM | POA: Diagnosis not present

## 2024-02-20 DIAGNOSIS — R109 Unspecified abdominal pain: Secondary | ICD-10-CM | POA: Diagnosis not present

## 2024-02-20 DIAGNOSIS — K921 Melena: Secondary | ICD-10-CM | POA: Diagnosis not present

## 2024-02-20 DIAGNOSIS — R42 Dizziness and giddiness: Secondary | ICD-10-CM | POA: Diagnosis not present

## 2024-02-29 DIAGNOSIS — M542 Cervicalgia: Secondary | ICD-10-CM | POA: Diagnosis not present

## 2024-02-29 DIAGNOSIS — H8192 Unspecified disorder of vestibular function, left ear: Secondary | ICD-10-CM | POA: Diagnosis not present

## 2024-02-29 DIAGNOSIS — M6281 Muscle weakness (generalized): Secondary | ICD-10-CM | POA: Diagnosis not present

## 2024-03-07 DIAGNOSIS — H8192 Unspecified disorder of vestibular function, left ear: Secondary | ICD-10-CM | POA: Diagnosis not present

## 2024-03-07 DIAGNOSIS — L03317 Cellulitis of buttock: Secondary | ICD-10-CM | POA: Diagnosis not present

## 2024-03-07 DIAGNOSIS — M6281 Muscle weakness (generalized): Secondary | ICD-10-CM | POA: Diagnosis not present

## 2024-03-07 DIAGNOSIS — B9689 Other specified bacterial agents as the cause of diseases classified elsewhere: Secondary | ICD-10-CM | POA: Diagnosis not present

## 2024-03-07 DIAGNOSIS — Z85828 Personal history of other malignant neoplasm of skin: Secondary | ICD-10-CM | POA: Diagnosis not present

## 2024-03-07 DIAGNOSIS — B078 Other viral warts: Secondary | ICD-10-CM | POA: Diagnosis not present

## 2024-03-07 DIAGNOSIS — M542 Cervicalgia: Secondary | ICD-10-CM | POA: Diagnosis not present

## 2024-03-09 DIAGNOSIS — H8192 Unspecified disorder of vestibular function, left ear: Secondary | ICD-10-CM | POA: Diagnosis not present

## 2024-03-09 DIAGNOSIS — M6281 Muscle weakness (generalized): Secondary | ICD-10-CM | POA: Diagnosis not present

## 2024-03-09 DIAGNOSIS — M542 Cervicalgia: Secondary | ICD-10-CM | POA: Diagnosis not present

## 2024-03-12 DIAGNOSIS — H8192 Unspecified disorder of vestibular function, left ear: Secondary | ICD-10-CM | POA: Diagnosis not present

## 2024-03-12 DIAGNOSIS — M542 Cervicalgia: Secondary | ICD-10-CM | POA: Diagnosis not present

## 2024-03-12 DIAGNOSIS — M6281 Muscle weakness (generalized): Secondary | ICD-10-CM | POA: Diagnosis not present

## 2024-03-13 ENCOUNTER — Other Ambulatory Visit: Payer: Self-pay | Admitting: Family Medicine

## 2024-03-13 DIAGNOSIS — R42 Dizziness and giddiness: Secondary | ICD-10-CM

## 2024-03-14 ENCOUNTER — Encounter: Payer: Self-pay | Admitting: Family Medicine

## 2024-03-15 ENCOUNTER — Other Ambulatory Visit: Payer: Self-pay | Admitting: Family Medicine

## 2024-03-15 DIAGNOSIS — R42 Dizziness and giddiness: Secondary | ICD-10-CM

## 2024-03-16 DIAGNOSIS — H8192 Unspecified disorder of vestibular function, left ear: Secondary | ICD-10-CM | POA: Diagnosis not present

## 2024-03-16 DIAGNOSIS — M6281 Muscle weakness (generalized): Secondary | ICD-10-CM | POA: Diagnosis not present

## 2024-03-16 DIAGNOSIS — M542 Cervicalgia: Secondary | ICD-10-CM | POA: Diagnosis not present

## 2024-03-19 DIAGNOSIS — H8192 Unspecified disorder of vestibular function, left ear: Secondary | ICD-10-CM | POA: Diagnosis not present

## 2024-03-19 DIAGNOSIS — M6281 Muscle weakness (generalized): Secondary | ICD-10-CM | POA: Diagnosis not present

## 2024-03-19 DIAGNOSIS — M542 Cervicalgia: Secondary | ICD-10-CM | POA: Diagnosis not present

## 2024-03-23 DIAGNOSIS — H8192 Unspecified disorder of vestibular function, left ear: Secondary | ICD-10-CM | POA: Diagnosis not present

## 2024-03-23 DIAGNOSIS — M542 Cervicalgia: Secondary | ICD-10-CM | POA: Diagnosis not present

## 2024-03-23 DIAGNOSIS — M6281 Muscle weakness (generalized): Secondary | ICD-10-CM | POA: Diagnosis not present

## 2024-03-28 ENCOUNTER — Other Ambulatory Visit

## 2024-03-28 DIAGNOSIS — J300 Vasomotor rhinitis: Secondary | ICD-10-CM | POA: Diagnosis not present

## 2024-03-28 DIAGNOSIS — L501 Idiopathic urticaria: Secondary | ICD-10-CM | POA: Diagnosis not present

## 2024-03-28 DIAGNOSIS — R0602 Shortness of breath: Secondary | ICD-10-CM | POA: Diagnosis not present

## 2024-04-03 ENCOUNTER — Ambulatory Visit
Admission: RE | Admit: 2024-04-03 | Discharge: 2024-04-03 | Disposition: A | Discharge status: Home / Self Care | Source: Ambulatory Visit | Attending: Family Medicine | Admitting: Family Medicine

## 2024-04-03 ENCOUNTER — Other Ambulatory Visit

## 2024-04-03 DIAGNOSIS — M4802 Spinal stenosis, cervical region: Secondary | ICD-10-CM | POA: Diagnosis not present

## 2024-04-03 DIAGNOSIS — M47812 Spondylosis without myelopathy or radiculopathy, cervical region: Secondary | ICD-10-CM | POA: Diagnosis not present

## 2024-04-03 DIAGNOSIS — R42 Dizziness and giddiness: Secondary | ICD-10-CM

## 2024-04-03 DIAGNOSIS — I6782 Cerebral ischemia: Secondary | ICD-10-CM | POA: Diagnosis not present

## 2024-04-03 DIAGNOSIS — R200 Anesthesia of skin: Secondary | ICD-10-CM | POA: Diagnosis not present

## 2024-04-30 DIAGNOSIS — R2689 Other abnormalities of gait and mobility: Secondary | ICD-10-CM | POA: Diagnosis not present

## 2024-04-30 DIAGNOSIS — R42 Dizziness and giddiness: Secondary | ICD-10-CM | POA: Diagnosis not present

## 2024-05-24 DIAGNOSIS — G245 Blepharospasm: Secondary | ICD-10-CM | POA: Diagnosis not present

## 2024-05-24 DIAGNOSIS — H26493 Other secondary cataract, bilateral: Secondary | ICD-10-CM | POA: Diagnosis not present

## 2024-05-24 DIAGNOSIS — L988 Other specified disorders of the skin and subcutaneous tissue: Secondary | ICD-10-CM | POA: Diagnosis not present

## 2024-05-30 DIAGNOSIS — R42 Dizziness and giddiness: Secondary | ICD-10-CM | POA: Diagnosis not present

## 2024-08-23 DIAGNOSIS — G245 Blepharospasm: Secondary | ICD-10-CM | POA: Diagnosis not present

## 2024-08-23 DIAGNOSIS — L988 Other specified disorders of the skin and subcutaneous tissue: Secondary | ICD-10-CM | POA: Diagnosis not present

## 2024-08-29 DIAGNOSIS — Z0110 Encounter for examination of ears and hearing without abnormal findings: Secondary | ICD-10-CM | POA: Diagnosis not present

## 2024-08-29 DIAGNOSIS — R42 Dizziness and giddiness: Secondary | ICD-10-CM | POA: Diagnosis not present

## 2024-08-29 DIAGNOSIS — Z822 Family history of deafness and hearing loss: Secondary | ICD-10-CM | POA: Diagnosis not present

## 2024-08-29 DIAGNOSIS — H9311 Tinnitus, right ear: Secondary | ICD-10-CM | POA: Diagnosis not present

## 2024-08-29 DIAGNOSIS — R519 Headache, unspecified: Secondary | ICD-10-CM | POA: Diagnosis not present

## 2024-08-29 DIAGNOSIS — H6121 Impacted cerumen, right ear: Secondary | ICD-10-CM | POA: Diagnosis not present

## 2024-08-29 DIAGNOSIS — Z79899 Other long term (current) drug therapy: Secondary | ICD-10-CM | POA: Diagnosis not present

## 2024-09-16 DIAGNOSIS — Z23 Encounter for immunization: Secondary | ICD-10-CM | POA: Diagnosis not present

## 2024-10-31 DIAGNOSIS — R42 Dizziness and giddiness: Secondary | ICD-10-CM | POA: Diagnosis not present

## 2024-10-31 DIAGNOSIS — R519 Headache, unspecified: Secondary | ICD-10-CM | POA: Diagnosis not present

## 2024-10-31 DIAGNOSIS — F410 Panic disorder [episodic paroxysmal anxiety] without agoraphobia: Secondary | ICD-10-CM | POA: Diagnosis not present

## 2024-11-07 DIAGNOSIS — F410 Panic disorder [episodic paroxysmal anxiety] without agoraphobia: Secondary | ICD-10-CM | POA: Diagnosis not present

## 2024-11-13 DIAGNOSIS — F410 Panic disorder [episodic paroxysmal anxiety] without agoraphobia: Secondary | ICD-10-CM | POA: Diagnosis not present

## 2024-11-27 DIAGNOSIS — F410 Panic disorder [episodic paroxysmal anxiety] without agoraphobia: Secondary | ICD-10-CM | POA: Diagnosis not present

## 2024-12-06 DIAGNOSIS — G245 Blepharospasm: Secondary | ICD-10-CM | POA: Diagnosis not present
